# Patient Record
Sex: Male | Born: 1965 | Race: White | Hispanic: No | Marital: Single | Smoking: Current every day smoker
Health system: Southern US, Community
[De-identification: ages and names within clinical notes are randomized; demographics above are authoritative.]

## PROBLEM LIST (undated history)

## (undated) DIAGNOSIS — C801 Malignant (primary) neoplasm, unspecified: Secondary | ICD-10-CM

## (undated) DIAGNOSIS — Z87442 Personal history of urinary calculi: Secondary | ICD-10-CM

## (undated) DIAGNOSIS — E119 Type 2 diabetes mellitus without complications: Secondary | ICD-10-CM

## (undated) DIAGNOSIS — N289 Disorder of kidney and ureter, unspecified: Secondary | ICD-10-CM

## (undated) DIAGNOSIS — J449 Chronic obstructive pulmonary disease, unspecified: Secondary | ICD-10-CM

## (undated) DIAGNOSIS — C61 Malignant neoplasm of prostate: Secondary | ICD-10-CM

## (undated) DIAGNOSIS — I10 Essential (primary) hypertension: Secondary | ICD-10-CM

## (undated) DIAGNOSIS — K219 Gastro-esophageal reflux disease without esophagitis: Secondary | ICD-10-CM

## (undated) DIAGNOSIS — R0600 Dyspnea, unspecified: Secondary | ICD-10-CM

## (undated) DIAGNOSIS — M1990 Unspecified osteoarthritis, unspecified site: Secondary | ICD-10-CM

## (undated) DIAGNOSIS — Z923 Personal history of irradiation: Secondary | ICD-10-CM

## (undated) DIAGNOSIS — R32 Unspecified urinary incontinence: Secondary | ICD-10-CM

## (undated) DIAGNOSIS — R51 Headache: Secondary | ICD-10-CM

## (undated) HISTORY — DX: Personal history of irradiation: Z92.3

## (undated) HISTORY — PX: KIDNEY STONE SURGERY: SHX686

## (undated) HISTORY — DX: Disorder of kidney and ureter, unspecified: N28.9

## (undated) HISTORY — PX: WISDOM TOOTH EXTRACTION: SHX21

---

## 1998-10-17 ENCOUNTER — Emergency Department (HOSPITAL_COMMUNITY): Admission: EM | Admit: 1998-10-17 | Discharge: 1998-10-17 | Payer: Self-pay | Admitting: Emergency Medicine

## 1998-10-17 ENCOUNTER — Encounter: Payer: Self-pay | Admitting: Emergency Medicine

## 2000-01-10 ENCOUNTER — Emergency Department (HOSPITAL_COMMUNITY): Admission: EM | Admit: 2000-01-10 | Discharge: 2000-01-11 | Payer: Self-pay | Admitting: Emergency Medicine

## 2000-01-11 ENCOUNTER — Encounter: Payer: Self-pay | Admitting: Emergency Medicine

## 2000-01-23 ENCOUNTER — Encounter: Payer: Self-pay | Admitting: *Deleted

## 2000-01-23 ENCOUNTER — Encounter: Admission: RE | Admit: 2000-01-23 | Discharge: 2000-01-23 | Payer: Self-pay | Admitting: *Deleted

## 2000-07-23 ENCOUNTER — Emergency Department (HOSPITAL_COMMUNITY): Admission: EM | Admit: 2000-07-23 | Discharge: 2000-07-23 | Payer: Self-pay | Admitting: Emergency Medicine

## 2000-07-30 ENCOUNTER — Emergency Department (HOSPITAL_COMMUNITY): Admission: EM | Admit: 2000-07-30 | Discharge: 2000-07-30 | Payer: Self-pay | Admitting: Emergency Medicine

## 2003-08-01 ENCOUNTER — Encounter: Admission: RE | Admit: 2003-08-01 | Discharge: 2003-08-01 | Payer: Self-pay | Admitting: Family Medicine

## 2003-08-25 ENCOUNTER — Emergency Department (HOSPITAL_COMMUNITY): Admission: EM | Admit: 2003-08-25 | Discharge: 2003-08-26 | Payer: Self-pay | Admitting: Emergency Medicine

## 2004-03-31 ENCOUNTER — Emergency Department (HOSPITAL_COMMUNITY): Admission: EM | Admit: 2004-03-31 | Discharge: 2004-03-31 | Payer: Self-pay | Admitting: Emergency Medicine

## 2004-04-26 ENCOUNTER — Ambulatory Visit (HOSPITAL_BASED_OUTPATIENT_CLINIC_OR_DEPARTMENT_OTHER): Admission: RE | Admit: 2004-04-26 | Discharge: 2004-04-26 | Payer: Self-pay | Admitting: Urology

## 2004-09-03 ENCOUNTER — Emergency Department (HOSPITAL_COMMUNITY): Admission: EM | Admit: 2004-09-03 | Discharge: 2004-09-03 | Payer: Self-pay | Admitting: Emergency Medicine

## 2004-09-26 ENCOUNTER — Emergency Department (HOSPITAL_COMMUNITY): Admission: EM | Admit: 2004-09-26 | Discharge: 2004-09-27 | Payer: Self-pay | Admitting: Emergency Medicine

## 2004-10-01 ENCOUNTER — Ambulatory Visit (HOSPITAL_COMMUNITY): Admission: RE | Admit: 2004-10-01 | Discharge: 2004-10-01 | Payer: Self-pay | Admitting: Urology

## 2007-08-17 ENCOUNTER — Emergency Department (HOSPITAL_COMMUNITY): Admission: EM | Admit: 2007-08-17 | Discharge: 2007-08-17 | Payer: Self-pay | Admitting: Emergency Medicine

## 2009-10-22 ENCOUNTER — Encounter: Admission: RE | Admit: 2009-10-22 | Discharge: 2009-10-22 | Payer: Self-pay | Admitting: Emergency Medicine

## 2010-04-28 ENCOUNTER — Encounter: Payer: Self-pay | Admitting: Urology

## 2010-08-23 NOTE — Op Note (Signed)
Tony Leon, ALDOUS                 ACCOUNT NO.:  [id number]   MEDICAL RECORD NO.:  [id number]          PATIENT TYPE:  AMB   LOCATION:  [facility]                         FACILITY:  [facility]   PHYSICIAN:  Jamison Neighbor, M.D.  DATE OF BIRTH:  30-Oct-1965   DATE OF PROCEDURE:  04/26/2004  DATE OF DISCHARGE:                                 OPERATIVE REPORT   PREOPERATIVE DIAGNOSIS:  Distal right ureteral calculus.   POSTOPERATIVE DIAGNOSIS:  Distal right ureteral calculus.   PROCEDURE:  Cystoscopy, right retrograde, right ureteroscopy with basket  extraction, right double-J catheter insertion.   SURGEON:  Jamison Neighbor, M.D.   ANESTHESIA:  General.   COMPLICATIONS:  None.   DRAINS:  A 6 French x26 cm double-J catheter.   BRIEF HISTORY:  This 45 year old male developed pain on the right-hand side  and was found on imaging studies to have a small stone impacted in the  distal right ureter.  Because the stone appeared to be small, in the range  of 2-3 mm, it was thought that it would pass.  He was given Uroxatral to aid  in the passage of the stone but has been unable to do so.  Patient has  burning, dysuria, urgency, and frequency caused by the impacted stone but  really does not have all that much in the way of pain.  Patient would like  to have this extracted.  He understands the risks and benefits of the  procedure and gave full informed consent.   PROCEDURE:  After the successful induction of general anesthesia, the  patient was placed in the dorsal lithotomy position, prepped with Betadine,  and draped in the usual sterile fashion.  Cystoscopy was performed.  The  urethra was visualized in its entirety and found to be normal.  Beyond the  verumontanum, the patient had no significant prostatic obstruction.  The  bladder was carefully inspected, and it was free of any tumor or stone.  The  left ureteral orifice was normal in configuration and location.  The right  ureteral orifice  was in an appropriate location, but there was clearly  obstruction in that area with heaped-up edema of the mucosa noted.  A  guidewire was passed into the ureter, but a retrograde study did not allow  any contrast to flow past the clear-cut obstruction in the very distal  portion of the intramural tunnel.  A guidewire was negotiated past that  area.  The ureteroscope was inserted but could not be passed beyond that  spot.  There did appear to be a tight, strictured area.  A balloon was then  passed over the wire, and the distal ureter was balloon-dilated with a 4 cm  balloon.  The ureteroscope was then reintroduced and passed up to the area  just below the UPJ.  The proximal ureter was dilated, but otherwise normal.  In the very distal ureter, there was a stone that was partially underneath  the mucosa following the balloon dilation.  A basket was placed around the  stone, which was then extracted.  The ureteroscope was  reinserted, and the  ureter was rechecked.  There was no evidence of injury.  There was no sign  of any other stone material.  The patient had enough swelling and  abnormalities of the distal ureter that it was felt he should have a stent.  A 6 French x26 cm double-J was passed over the guidewire and allowed to  coil, both in the kidney and in the pelvis.  The guidewire was  removed.  Cystoscopic examination confirmed that there was good placement of  the stent and appropriate position within the bladder.  The string was left  attached and was attached to the patient with tape.  This can be removed on  Sunday or Monday of this weekend.      RJE/MEDQ  D:  04/26/2004  T:  04/26/2004  Job:  [id number]   cc:   [facility]

## 2010-08-23 NOTE — Op Note (Signed)
NAMEDIARRA, Tony Leon                 ACCOUNT NO.:  [id number]   MEDICAL RECORD NO.:  [id number]          PATIENT TYPE:  AMB   LOCATION:  DAY                          FACILITY:  [facility]   PHYSICIAN:  Jamison Neighbor, M.D.  DATE OF BIRTH:  1965/06/18   DATE OF PROCEDURE:  10/01/2004  DATE OF DISCHARGE:                                 OPERATIVE REPORT   PREOPERATIVE DIAGNOSIS:  Distal left ureteral calculus.   POSTOPERATIVE DIAGNOSIS:  Distal left ureteral calculi.   PROCEDURE:  Cystoscopy, left retrograde ureteroscopy, left basket  extraction, and left double-J catheter insertion.   SURGEON:  Jamison Neighbor, M.D.   ANESTHESIA:  General.   COMPLICATIONS:  None.   DRAINS:  A 26 cm x 6 French double-J catheter attached to a string.   HISTORY:  This 45 year old male has had a stone extraction done on the right-  hand side about a year ago.  The patient was thought to have a small stone  in the left kidney, which was thought would pass.  The patient was trying to  pass this now for some time, and it has been passed into the distal left  ureter.  Recent CT scan confirmed the presence of the stone impacted in the  ureter.  The patient is uncomfortable and would like to have this removed.  He understands the risks and benefits of the procedure and gave full,  informed consent.   PROCEDURE:  After successful induction of general anesthesia, the patient  was placed in the dorsal lithotomy position and prepped with Betadine,  draped in the usual sterile fashion.  A cystoscopy was performed, and the  urethra was visualized in its entirety.  It was found to be normal beyond  the verumontanum.  There was minimal prostatic enlargement and no signs of  obstruction.  The bladder was carefully inspected.  It was free of any tumor  or stones.  Both ureteral orifices were normal in configuration and  location.  A retrograde study performed on the left-hand side showed a  severely impacted ureter.   Attempts to get a wire passed were unsuccessful,  and there did appear to be a little bit of extravasation of contrast due to  the manipulation.  The ureteroscope was inserted, and it was actually passed  beyond the stone, and normal ureter was identified.  A guidewire was then  passed through the ureteroscope and up into the kidney.  The ureteroscope  was withdrawn and then reinserted alongside the guidewire.  The patient was  found to actually have two stones, the largest had been previously  identified, and then a very small stone adjacent to it.  These were grasped  in the nitinol basket and removed simultaneously.  The larger stone will be  sent for specimen.  The smaller stone was flushed out of the bladder.  The  ureteroscope was reinserted, and the ureter was felt to be unremarkable.  There was some mucosal damage where the stone had been impacted, and it was  felt that a stent should be placed.  A stent was  passed over the guidewire  but attached to a string so that the patient can removed  this in 2-3 days.  The bladder was drained.  The patient tolerated the procedure well and was  taken to  the recovery room in good condition.  He will be sent home with Tylox,  Pyridium Plus, and Septra DS and is to return to see me in followup in about  two weeks.  He will be instructed to pull the catheter out in three days if  he can tolerate it.     _______________    RJE/MEDQ  D:  10/01/2004  T:  10/01/2004  Job:  [id number]   cc:   Oley Balm. Georgina Pillion, M.D.  [address]  [place]  [place] [address]  Fax: [phone]

## 2012-04-19 ENCOUNTER — Encounter (HOSPITAL_COMMUNITY): Payer: Self-pay | Admitting: *Deleted

## 2012-04-19 ENCOUNTER — Emergency Department (HOSPITAL_COMMUNITY)
Admission: EM | Admit: 2012-04-19 | Discharge: 2012-04-19 | Disposition: A | Discharge status: Home / Self Care | Payer: BC Managed Care – PPO | Attending: Emergency Medicine | Admitting: Emergency Medicine

## 2012-04-19 ENCOUNTER — Emergency Department (HOSPITAL_COMMUNITY): Payer: BC Managed Care – PPO

## 2012-04-19 DIAGNOSIS — I10 Essential (primary) hypertension: Secondary | ICD-10-CM

## 2012-04-19 DIAGNOSIS — Z79899 Other long term (current) drug therapy: Secondary | ICD-10-CM | POA: Insufficient documentation

## 2012-04-19 DIAGNOSIS — F17200 Nicotine dependence, unspecified, uncomplicated: Secondary | ICD-10-CM | POA: Insufficient documentation

## 2012-04-19 DIAGNOSIS — R51 Headache: Secondary | ICD-10-CM

## 2012-04-19 DIAGNOSIS — Z7982 Long term (current) use of aspirin: Secondary | ICD-10-CM | POA: Insufficient documentation

## 2012-04-19 MED ORDER — LISINOPRIL 10 MG PO TABS: 10.0000 mg | ORAL_TABLET | Freq: Every day | ORAL | Status: DC

## 2012-04-19 MED ORDER — KETOROLAC TROMETHAMINE 60 MG/2ML IM SOLN
60.0000 mg | Freq: Once | INTRAMUSCULAR | Status: AC
  Administered 2012-04-19: 60 mg via INTRAMUSCULAR
  Filled 2012-04-19: qty 2

## 2012-04-19 NOTE — ED Provider Notes (Signed)
History     CSN: [id number]  Arrival date & time 04/19/12  7829   First MD Initiated Contact with Patient 04/19/12 0701      Chief Complaint  Patient presents with  . Headache    (Consider location/radiation/quality/duration/timing/severity/associated sxs/prior treatment) HPI Comments: Patient started with cough on Friday.  After coughing he developed a severe pain in the back of his head, like he was "hit with a bat".  This lasted for several hours and seemed to improve.  It happened again yesterday and has persisted into today.  He denies fevers or chills.  No n/v/d.  Has checked his blood pressure on several occasions and was high.  He then took a dose of his roommate's bp med and came here to be evaluated.  Patient is a 47 y.o. male presenting with headaches. The history is provided by the patient.  Headache  This is a new problem. Episode onset: 3 days ago. The problem occurs constantly. The problem has been gradually improving. The headache is associated with bright light and coughing. The pain is located in the occipital region. The quality of the pain is described as throbbing. The pain is moderate. The pain does not radiate. Pertinent negatives include no fever. He has tried nothing for the symptoms.    History reviewed. No pertinent past medical history.  History reviewed. No pertinent past surgical history.  No family history on file.  History  Substance Use Topics  . Smoking status: Current Every Day Smoker -- 2.0 packs/day    Types: Cigarettes  . Smokeless tobacco: Not on file  . Alcohol Use: No      Review of Systems  Constitutional: Negative for fever.  Neurological: Positive for headaches.  All other systems reviewed and are negative.    Allergies  Review of patient's allergies indicates no known allergies.  Home Medications   Current Outpatient Rx  Name  Route  Sig  Dispense  Refill  . ASPIRIN 325 MG PO TBEC   Oral   Take 325 mg by mouth daily.         . IBUPROFEN 200 MG PO TABS   Oral   Take 200 mg by mouth every 6 (six) hours as needed. pain         . LISINOPRIL 10 MG PO TABS   Oral   Take 10 mg by mouth daily.           BP 138/89  Pulse 78  Temp 97.7 F (36.5 C)  Resp 20  SpO2 98%  Physical Exam  Nursing note and vitals reviewed. Constitutional: He is oriented to person, place, and time. He appears well-developed and well-nourished. No distress.  HENT:  Head: Normocephalic and atraumatic.  Mouth/Throat: Oropharynx is clear and moist.  Eyes: EOM are normal. Pupils are equal, round, and reactive to light.       There is no papilledema upon fundoscopic exam.    Neck: Normal range of motion. Neck supple.  Cardiovascular: Normal rate and regular rhythm.  Exam reveals no friction rub.   No murmur heard. Pulmonary/Chest: Effort normal and breath sounds normal. No respiratory distress.  Abdominal: Soft. Bowel sounds are normal. He exhibits no distension.  Musculoskeletal: Normal range of motion. He exhibits no edema.  Lymphadenopathy:    He has no cervical adenopathy.  Neurological: He is alert and oriented to person, place, and time. No cranial nerve deficit. He exhibits normal muscle tone. Coordination normal.  Skin: Skin is warm and dry.  He is not diaphoretic.    ED Course  Procedures (including critical care time)  Labs Reviewed - No data to display Ct Head Wo Contrast  04/19/2012  *RADIOLOGY REPORT*  Clinical Data: Hip pain.  Headaches.  Hypertension.  CT HEAD WITHOUT CONTRAST  Technique:  Contiguous axial images were obtained from the base of the skull through the vertex without contrast.  Comparison: None.  Findings: No evidence of acute infarct, acute hemorrhage, mass lesion, mass effect or hydrocephalus.  No air fluid levels in the paranasal sinuses or mastoid air cells.  IMPRESSION: Negative.   Original Report Authenticated By: Leanna Battles, M.D.      No diagnosis found.    MDM  The ct of the  head is negative for tumor or bleed.  He was given toradol for his pain.  His blood pressure initially was elevated, but normalized within minutes of it being rechecked.  He is neurologically intact and otherwise appears well.  He has no pcp and wants to be started on an antihypertensive until he can obtain a pcp.        Geoffery Lyons, MD 04/19/12 [phone]

## 2012-04-19 NOTE — ED Notes (Signed)
Pt states Friday morning coughed and had extreme pain in back of head; over the weekend has had waves of a headache; has checked bp multiple times and has been elevated; pt took friends lisinopril 10 mg at 540 am this morning

## 2012-11-19 ENCOUNTER — Other Ambulatory Visit: Payer: Self-pay | Admitting: Urology

## 2012-11-19 DIAGNOSIS — C61 Malignant neoplasm of prostate: Secondary | ICD-10-CM

## 2012-11-22 ENCOUNTER — Other Ambulatory Visit: Payer: Self-pay | Admitting: Urology

## 2012-11-30 ENCOUNTER — Encounter (HOSPITAL_COMMUNITY)
Admission: RE | Admit: 2012-11-30 | Discharge: 2012-11-30 | Disposition: A | Discharge status: Home / Self Care | Payer: BC Managed Care – PPO | Source: Ambulatory Visit | Attending: Urology | Admitting: Urology

## 2012-11-30 ENCOUNTER — Encounter (HOSPITAL_COMMUNITY): Payer: Self-pay | Admitting: Pharmacy Technician

## 2012-11-30 DIAGNOSIS — C61 Malignant neoplasm of prostate: Secondary | ICD-10-CM | POA: Insufficient documentation

## 2012-11-30 MED ORDER — TECHNETIUM TC 99M MEDRONATE IV KIT
25.0000 | PACK | Freq: Once | INTRAVENOUS | Status: AC | PRN
  Administered 2012-11-30: 25 via INTRAVENOUS

## 2012-12-10 ENCOUNTER — Encounter (HOSPITAL_COMMUNITY)
Admission: RE | Admit: 2012-12-10 | Discharge: 2012-12-10 | Disposition: A | Discharge status: Home / Self Care | Payer: BC Managed Care – PPO | Source: Ambulatory Visit | Attending: Urology | Admitting: Urology

## 2012-12-10 ENCOUNTER — Ambulatory Visit (HOSPITAL_COMMUNITY)
Admission: RE | Admit: 2012-12-10 | Discharge: 2012-12-10 | Disposition: A | Discharge status: Home / Self Care | Payer: BC Managed Care – PPO | Source: Ambulatory Visit | Attending: Urology | Admitting: Urology

## 2012-12-10 ENCOUNTER — Encounter (HOSPITAL_COMMUNITY): Payer: Self-pay

## 2012-12-10 DIAGNOSIS — Z01812 Encounter for preprocedural laboratory examination: Secondary | ICD-10-CM | POA: Insufficient documentation

## 2012-12-10 DIAGNOSIS — Z01810 Encounter for preprocedural cardiovascular examination: Secondary | ICD-10-CM | POA: Insufficient documentation

## 2012-12-10 DIAGNOSIS — Z87442 Personal history of urinary calculi: Secondary | ICD-10-CM

## 2012-12-10 DIAGNOSIS — Z01818 Encounter for other preprocedural examination: Secondary | ICD-10-CM | POA: Insufficient documentation

## 2012-12-10 DIAGNOSIS — C801 Malignant (primary) neoplasm, unspecified: Secondary | ICD-10-CM | POA: Insufficient documentation

## 2012-12-10 DIAGNOSIS — C61 Malignant neoplasm of prostate: Secondary | ICD-10-CM | POA: Insufficient documentation

## 2012-12-10 HISTORY — DX: Personal history of urinary calculi: Z87.442

## 2012-12-10 HISTORY — DX: Malignant (primary) neoplasm, unspecified: C80.1

## 2012-12-10 HISTORY — DX: Type 2 diabetes mellitus without complications: E11.9

## 2012-12-10 HISTORY — DX: Gastro-esophageal reflux disease without esophagitis: K21.9

## 2012-12-10 HISTORY — DX: Headache: R51

## 2012-12-10 HISTORY — DX: Essential (primary) hypertension: I10

## 2012-12-10 LAB — BASIC METABOLIC PANEL
BUN: 11 mg/dL (ref 6–23)
CO2: 30 mEq/L (ref 19–32)
Calcium: 9.9 mg/dL (ref 8.4–10.5)
Chloride: 100 mEq/L (ref 96–112)
Creatinine, Ser: 0.96 mg/dL (ref 0.50–1.35)
GFR calc Af Amer: >90 mL/min (ref >90)

## 2012-12-10 LAB — CBC
HCT: 44.3 % (ref 39.0–52.0)
MCH: 32.4 pg (ref 26.0–34.0)
MCV: 91.9 fL (ref 78.0–100.0)
RDW: 13.2 % (ref 11.5–15.5)
WBC: 7.8 10*3/uL (ref 4.0–10.5)

## 2012-12-10 NOTE — Pre-Procedure Instructions (Addendum)
12-10-12 EKG/ CXR done today. Pt. Stop/Bang score = 4 with preop screening, note faxed to Dr. Pricilla Loveless.

## 2012-12-10 NOTE — Patient Instructions (Addendum)
20 ZAHKI HOOGENDOORN  12/10/2012   Your procedure is scheduled on:  9-10 -2014  Report to [facility] at     0930   AM .  Call this number if you have problems the morning of surgery: [phone]  Or Presurgical Testing [phone](Raivyn Kabler)   Remember: Follow any bowel prep instructions per MD office.    Do not eat food:After Midnight.  May have clear liquids:up to 6 Hours before arrival. Nothing after : 0600 AM  Clear liquids include soda, tea, black coffee, apple or grape juice, broth.  Take these medicines the morning of surgery with A SIP OF WATER: none   Do not wear jewelry, make-up or nail polish.  Do not wear lotions, powders, or perfumes. You may wear deodorant.  Do not shave 12 hours prior to first CHG shower(legs and under arms).(face and neck okay.)  Do not bring valuables to the hospital.  Contacts, dentures or bridgework,body piercing,  may not be worn into surgery.  Leave suitcase in the car. After surgery it may be brought to your room.  For patients admitted to the hospital, checkout time is 11:00 AM the day of discharge.   Patients discharged the day of surgery will not be allowed to drive home. Must have responsible person with you x 24 hours once discharged.  Name and phone number of your driver: Marjie Skiff- [phone] cell  Special Instructions: CHG(Chlorhedine 4%-"Hibiclens","Betasept","Aplicare") Shower Use Special Wash: see special instructions.(avoid face and genitals)   Please read over the following fact sheets that you were given: Blood Transfusion fact sheet, Incentive Spirometry Instruction.    Failure to follow these instructions may result in Cancellation of your surgery.   Patient signature_______________________________________________________

## 2012-12-10 NOTE — Progress Notes (Signed)
Your Pt has screened with an elevated risk for obstructive sleep apnea using the Stop-Bang tool during a presurgical  Visit. A score of four or greater is an elevated risk. 

## 2012-12-14 NOTE — H&P (Signed)
ctive Problems Problems  1. Prostate Hard Area Or Nodule 600.10 2. PSA,Elevated 790.93  History of Present Illness  Mr. Tony Leon is a 47 yo male sent by C. Wells NP for an elevated PSA of 16.8 in 6/14.   He had another level in January that was also elevated.  He is voiding without complaints.  He has occasional nocturia.  He has no history of prostatitis or a family history of prostate cancer.  He has a history of stones that were treated by Dr. Logan Bores prior to 2006 and required ureteroscopy bilaterally with stents.  He was recently diagnosed with diabetes and gout.  He was also given losartin and had itching and had to stop it.  He still has itching which he has to treat with benadryl.  He was on metformin but had to stop that.  He was given Cipro on 6/27 for the high PSA and he remains on those.   Past Medical History Problems  1. History of  Diabetes Mellitus 250.00 2. History of  Gout 274.9 3. History of  Hypertension 401.9 4. History of  Nephrolithiasis V13.01  Surgical History Problems  1. History of  Cystoscopy With Ureteroscopy With Removal Of Calculus  Current Meds 1. Ciprofloxacin HCl 500 MG Oral Tablet; Therapy: 27Jun2014 to 2. Lisinopril 10 MG Oral Tablet; Therapy: 27Jun2014 to 3. Triamcinolone Acetonide 0.1 % External Ointment; Therapy: 27Jun2014 to  Allergies Medication  1. Losartan Potassium TABS  Family History Problems  1. Family history of  Family Health Status Number Of Children  Social History Problems    Alcohol Use   Caffeine Use   Marital History - Single   Occupation:   Tobacco Use 305.1  Review of Systems Genitourinary, constitutional, skin, eye, otolaryngeal, hematologic/lymphatic, cardiovascular, pulmonary, endocrine, musculoskeletal, gastrointestinal, neurological and psychiatric system(s) were reviewed and pertinent findings if present are noted.  Gastrointestinal: nausea   The patient presents with complaints of diarrhea (with the  metformin).  Integumentary: pruritus.  Eyes: blurred vision.  Respiratory: shortness of breath and cough.    Vitals Vital Signs [Data Includes: Last 1 Day]  08Jul2014 09:44AM  BMI Calculated: 37.22 BSA Calculated: 2.34 Height: 5 ft 10 in Weight: 260 lb  Blood Pressure: 127 / 81 Temperature: 98.1 F Heart Rate: 91  Physical Exam Constitutional: Well nourished and well developed . No acute distress.  ENT:. The ears and nose are normal in appearance.  Neck: The appearance of the neck is normal and no neck mass is present.  Pulmonary: No respiratory distress and normal respiratory rhythm and effort.  Cardiovascular: Heart rate and rhythm are normal . No peripheral edema.  Abdomen: The abdomen is obese. The abdomen is soft and nontender. No masses are palpated. No CVA tenderness. No hernias are palpable. No hepatosplenomegaly noted.  Rectal: Rectal exam demonstrates normal sphincter tone, no tenderness and no masses. Estimated prostate size is 2+. The prostate has a palpable nodule involving the right, apex, mid aspect of the prostate, is indurated (bilateral) and is not tender. The left seminal vesicle is nonpalpable. The right seminal vesicle is nonpalpable. The perineum is normal on inspection.  Genitourinary: Examination of the penis demonstrates no discharge, no masses, no lesions and a normal meatus. The scrotum is without lesions. The right epididymis is palpably normal and non-tender. The left epididymis is palpably normal and non-tender. The right testis is non-tender and without masses. The left testis is non-tender and without masses.  Lymphatics: The supraclavicular, femoral and inguinal nodes are not enlarged  or tender.  Skin: Normal skin turgor, no visible rash and no visible skin lesions.  Neuro/Psych:. Mood and affect are appropriate. Normal sensation of the perineum/perianal region (S3,4,5).    Results/Data Urine [Data Includes: Last 1 Day]   08Jul2014  COLOR YELLOW    APPEARANCE CLEAR   SPECIFIC GRAVITY 1.020   pH 5.5   GLUCOSE > 1000 mg/dL  BILIRUBIN NEG   KETONE NEG mg/dL  BLOOD TRACE   PROTEIN NEG mg/dL  UROBILINOGEN 0.2 mg/dL  NITRITE NEG   LEUKOCYTE ESTERASE NEG   SQUAMOUS EPITHELIAL/HPF RARE   WBC NONE SEEN WBC/hpf  RBC 0-2 RBC/hpf  BACTERIA RARE   CRYSTALS NONE SEEN   CASTS NONE SEEN    The following clinical lab reports were reviewed:  I have reviewed his labs from his PCP.    Assessment Assessed  1. Prostate Hard Area Or Nodule 600.10 2. PSA,Elevated 790.93   He has a markedly elevated PSA with prostate induration and nodular.   And on biospy was found to have extensive prostate cancer both Gleason 6 and 7 that is stage T2a N0 M0  Plan Health Maintenance (V70.0)  1. UA With REFLEX  Done: 08Jul2014 09:07AM PSA,Elevated (790.93)  2. Levofloxacin 500 MG Oral Tablet; Take one tablet daily starting day before procedure; Therapy:  08Jul2014 to (Evaluate:11Jul2014); Last Rx:08Jul2014 3. Follow-up Office  Follow-up  Requested for: 08Jul2014 4. PROSTATE BIOPSY ULTRASOUND  Requested for: 08Jul2014  He has elected robotic prostatectomy with node dissection for initial therapy. Marland Kitchen

## 2012-12-15 ENCOUNTER — Ambulatory Visit (HOSPITAL_COMMUNITY): Payer: BC Managed Care – PPO | Admitting: Anesthesiology

## 2012-12-15 ENCOUNTER — Encounter (HOSPITAL_COMMUNITY): Payer: Self-pay | Admitting: Anesthesiology

## 2012-12-15 ENCOUNTER — Observation Stay (HOSPITAL_COMMUNITY)
Admission: RE | Admit: 2012-12-15 | Discharge: 2012-12-16 | Disposition: A | Discharge status: Home / Self Care | Payer: BC Managed Care – PPO | Source: Ambulatory Visit | Attending: Urology | Admitting: Urology

## 2012-12-15 ENCOUNTER — Encounter (HOSPITAL_COMMUNITY): Payer: Self-pay | Admitting: *Deleted

## 2012-12-15 ENCOUNTER — Encounter (HOSPITAL_COMMUNITY)
Admission: RE | Disposition: A | Discharge status: Home / Self Care | Payer: Self-pay | Source: Ambulatory Visit | Attending: Urology

## 2012-12-15 DIAGNOSIS — M109 Gout, unspecified: Secondary | ICD-10-CM | POA: Insufficient documentation

## 2012-12-15 DIAGNOSIS — E119 Type 2 diabetes mellitus without complications: Secondary | ICD-10-CM | POA: Insufficient documentation

## 2012-12-15 DIAGNOSIS — Z87442 Personal history of urinary calculi: Secondary | ICD-10-CM | POA: Insufficient documentation

## 2012-12-15 DIAGNOSIS — C61 Malignant neoplasm of prostate: Principal | ICD-10-CM | POA: Insufficient documentation

## 2012-12-15 DIAGNOSIS — Z79899 Other long term (current) drug therapy: Secondary | ICD-10-CM | POA: Insufficient documentation

## 2012-12-15 DIAGNOSIS — I10 Essential (primary) hypertension: Secondary | ICD-10-CM | POA: Insufficient documentation

## 2012-12-15 HISTORY — PX: ROBOT ASSISTED LAPAROSCOPIC RADICAL PROSTATECTOMY: SHX5141

## 2012-12-15 LAB — HEMOGLOBIN AND HEMATOCRIT, BLOOD
HCT: 42.1 % (ref 39.0–52.0)
Hemoglobin: 14.4 g/dL (ref 13.0–17.0)

## 2012-12-15 LAB — GLUCOSE, CAPILLARY
Glucose-Capillary: 147 mg/dL — ABNORMAL HIGH (ref 70–99)
Glucose-Capillary: 152 mg/dL — ABNORMAL HIGH (ref 70–99)
Glucose-Capillary: 157 mg/dL — ABNORMAL HIGH (ref 70–99)

## 2012-12-15 LAB — ABO/RH: ABO/RH(D): O POS

## 2012-12-15 LAB — TYPE AND SCREEN: Antibody Screen: NEGATIVE

## 2012-12-15 SURGERY — ROBOTIC ASSISTED LAPAROSCOPIC RADICAL PROSTATECTOMY
Anesthesia: General | Site: Abdomen | Wound class: Clean Contaminated

## 2012-12-15 MED ORDER — KETOROLAC TROMETHAMINE 15 MG/ML IJ SOLN
15.0000 mg | Freq: Four times a day (QID) | INTRAMUSCULAR | Status: DC
  Administered 2012-12-15 – 2012-12-16 (×3): 15 mg via INTRAVENOUS
  Filled 2012-12-15 (×6): qty 1

## 2012-12-15 MED ORDER — MIDAZOLAM HCL 5 MG/5ML IJ SOLN
INTRAMUSCULAR | Status: DC | PRN
  Administered 2012-12-15: 2 mg via INTRAVENOUS

## 2012-12-15 MED ORDER — SUCCINYLCHOLINE CHLORIDE 20 MG/ML IJ SOLN
INTRAMUSCULAR | Status: DC | PRN
  Administered 2012-12-15: 100 mg via INTRAVENOUS

## 2012-12-15 MED ORDER — MORPHINE SULFATE 2 MG/ML IJ SOLN
2.0000 mg | INTRAMUSCULAR | Status: DC | PRN
  Administered 2012-12-15 – 2012-12-16 (×2): 2 mg via INTRAVENOUS
  Filled 2012-12-15 (×2): qty 1

## 2012-12-15 MED ORDER — HYDROMORPHONE HCL PF 1 MG/ML IJ SOLN
INTRAMUSCULAR | Status: AC
  Administered 2012-12-15: 16:00:00
  Filled 2012-12-15: qty 1

## 2012-12-15 MED ORDER — GLYCOPYRROLATE 0.2 MG/ML IJ SOLN
INTRAMUSCULAR | Status: DC | PRN
  Administered 2012-12-15: .6 mg via INTRAVENOUS

## 2012-12-15 MED ORDER — HYDROCODONE-ACETAMINOPHEN 5-325 MG PO TABS
1.0000 | ORAL_TABLET | ORAL | Status: DC | PRN
  Administered 2012-12-15: 1 via ORAL
  Administered 2012-12-16: 2 via ORAL
  Filled 2012-12-15: qty 1
  Filled 2012-12-15: qty 2

## 2012-12-15 MED ORDER — PANTOPRAZOLE SODIUM 40 MG PO TBEC
40.0000 mg | DELAYED_RELEASE_TABLET | Freq: Every day | ORAL | Status: DC
  Administered 2012-12-15 – 2012-12-16 (×2): 40 mg via ORAL
  Filled 2012-12-15 (×2): qty 1

## 2012-12-15 MED ORDER — LISINOPRIL 10 MG PO TABS
10.0000 mg | ORAL_TABLET | Freq: Every day | ORAL | Status: DC
  Administered 2012-12-15: 21:00:00 10 mg via ORAL
  Filled 2012-12-15 (×2): qty 1

## 2012-12-15 MED ORDER — LABETALOL HCL 5 MG/ML IV SOLN
5.0000 mg | INTRAVENOUS | Status: DC | PRN
  Administered 2012-12-15: 5 mg via INTRAVENOUS

## 2012-12-15 MED ORDER — DIPHENHYDRAMINE HCL 25 MG PO CAPS
25.0000 mg | ORAL_CAPSULE | Freq: Four times a day (QID) | ORAL | Status: DC | PRN
  Administered 2012-12-16: 03:00:00 25 mg via ORAL
  Filled 2012-12-15: qty 1

## 2012-12-15 MED ORDER — DEXTROSE-NACL 5-0.45 % IV SOLN
INTRAVENOUS | Status: DC
  Administered 2012-12-15 – 2012-12-16 (×2): via INTRAVENOUS

## 2012-12-15 MED ORDER — HYDROCODONE-ACETAMINOPHEN 5-325 MG PO TABS: 1.0000 | ORAL_TABLET | Freq: Four times a day (QID) | ORAL | Status: DC | PRN

## 2012-12-15 MED ORDER — SODIUM CHLORIDE 0.9 % IR SOLN
Status: DC | PRN
  Administered 2012-12-15: 300 mL via INTRAVESICAL

## 2012-12-15 MED ORDER — CIPROFLOXACIN HCL 500 MG PO TABS: 500.0000 mg | ORAL_TABLET | Freq: Two times a day (BID) | ORAL | Status: DC

## 2012-12-15 MED ORDER — NEOSTIGMINE METHYLSULFATE 1 MG/ML IJ SOLN
INTRAMUSCULAR | Status: DC | PRN
  Administered 2012-12-15: 4 mg via INTRAVENOUS

## 2012-12-15 MED ORDER — ONDANSETRON HCL 4 MG/2ML IJ SOLN
INTRAMUSCULAR | Status: DC | PRN
  Administered 2012-12-15: 4 mg via INTRAVENOUS

## 2012-12-15 MED ORDER — LABETALOL HCL 5 MG/ML IV SOLN
INTRAVENOUS | Status: AC
  Administered 2012-12-15: 17:00:00
  Filled 2012-12-15: qty 4

## 2012-12-15 MED ORDER — ACETAMINOPHEN 325 MG PO TABS: 650.0000 mg | ORAL_TABLET | ORAL | Status: DC | PRN

## 2012-12-15 MED ORDER — PROPOFOL 10 MG/ML IV BOLUS
INTRAVENOUS | Status: DC | PRN
  Administered 2012-12-15: 200 mg via INTRAVENOUS

## 2012-12-15 MED ORDER — SODIUM CHLORIDE 0.9 % IV BOLUS (SEPSIS)
1000.0000 mL | Freq: Once | INTRAVENOUS | Status: AC
  Administered 2012-12-15: 1000 mL via INTRAVENOUS

## 2012-12-15 MED ORDER — HEPARIN SODIUM (PORCINE) 1000 UNIT/ML IJ SOLN
INTRAMUSCULAR | Status: AC
  Filled 2012-12-15: qty 1

## 2012-12-15 MED ORDER — HYDROMORPHONE HCL PF 1 MG/ML IJ SOLN
0.2500 mg | INTRAMUSCULAR | Status: DC | PRN
Start: 2012-12-15 — End: 2012-12-15
  Administered 2012-12-15 (×4): 0.5 mg via INTRAVENOUS

## 2012-12-15 MED ORDER — FENTANYL CITRATE 0.05 MG/ML IJ SOLN
INTRAMUSCULAR | Status: DC | PRN
  Administered 2012-12-15 (×3): 100 ug via INTRAVENOUS
  Administered 2012-12-15 (×3): 50 ug via INTRAVENOUS
  Administered 2012-12-15: 150 ug via INTRAVENOUS

## 2012-12-15 MED ORDER — OMEPRAZOLE MAGNESIUM 20 MG PO TBEC
20.0000 mg | DELAYED_RELEASE_TABLET | Freq: Every day | ORAL | Status: DC
Start: 2012-12-15 — End: 2012-12-15

## 2012-12-15 MED ORDER — CEFAZOLIN SODIUM-DEXTROSE 2-3 GM-% IV SOLR
INTRAVENOUS | Status: AC
  Filled 2012-12-15: qty 50

## 2012-12-15 MED ORDER — LACTATED RINGERS IV SOLN
INTRAVENOUS | Status: DC | PRN
  Administered 2012-12-15: 13:00:00

## 2012-12-15 MED ORDER — LIDOCAINE HCL (PF) 2 % IJ SOLN
INTRAMUSCULAR | Status: DC | PRN
  Administered 2012-12-15: 100 mg

## 2012-12-15 MED ORDER — LACTATED RINGERS IV SOLN
INTRAVENOUS | Status: DC | PRN
  Administered 2012-12-15 (×3): via INTRAVENOUS

## 2012-12-15 MED ORDER — CISATRACURIUM BESYLATE (PF) 10 MG/5ML IV SOLN
INTRAVENOUS | Status: DC | PRN
  Administered 2012-12-15: 6 mg via INTRAVENOUS
  Administered 2012-12-15: 4 mg via INTRAVENOUS
  Administered 2012-12-15: 10 mg via INTRAVENOUS

## 2012-12-15 MED ORDER — PROMETHAZINE HCL 25 MG/ML IJ SOLN: 6.2500 mg | INTRAMUSCULAR | Status: DC | PRN

## 2012-12-15 MED ORDER — BUPIVACAINE-EPINEPHRINE 0.25% -1:200000 IJ SOLN
INTRAMUSCULAR | Status: DC | PRN
  Administered 2012-12-15: 30 mL

## 2012-12-15 MED ORDER — HYDROMORPHONE HCL PF 1 MG/ML IJ SOLN
INTRAMUSCULAR | Status: DC | PRN
  Administered 2012-12-15: 1 mg via INTRAVENOUS
  Administered 2012-12-15 (×2): 0.5 mg via INTRAVENOUS

## 2012-12-15 MED ORDER — CEFAZOLIN SODIUM-DEXTROSE 2-3 GM-% IV SOLR
2.0000 g | INTRAVENOUS | Status: AC
  Administered 2012-12-15: 2 g via INTRAVENOUS

## 2012-12-15 MED ORDER — INDIGOTINDISULFONATE SODIUM 8 MG/ML IJ SOLN
INTRAMUSCULAR | Status: DC | PRN
  Administered 2012-12-15: 5 mL via INTRAVENOUS

## 2012-12-15 MED ORDER — BELLADONNA ALKALOIDS-OPIUM 16.2-60 MG RE SUPP: 1.0000 | Freq: Four times a day (QID) | RECTAL | Status: DC | PRN

## 2012-12-15 MED ORDER — LACTATED RINGERS IV SOLN: INTRAVENOUS | Status: DC

## 2012-12-15 MED ORDER — BUPIVACAINE-EPINEPHRINE PF 0.25-1:200000 % IJ SOLN
INTRAMUSCULAR | Status: AC
  Filled 2012-12-15: qty 30

## 2012-12-15 SURGICAL SUPPLY — 46 items
CANISTER SUCTION 2500CC (MISCELLANEOUS) ×2 IMPLANT
CATH FOLEY 2WAY SLVR 18FR 30CC (CATHETERS) ×2 IMPLANT
CATH ROBINSON RED A/P 16FR (CATHETERS) ×2 IMPLANT
CATH ROBINSON RED A/P 8FR (CATHETERS) ×2 IMPLANT
CATH TIEMANN FOLEY 18FR 5CC (CATHETERS) ×2 IMPLANT
CHLORAPREP W/TINT 26ML (MISCELLANEOUS) ×2 IMPLANT
CLOTH BEACON ORANGE TIMEOUT ST (SAFETY) ×2 IMPLANT
CORD HIGH FREQUENCY UNIPOLAR (ELECTROSURGICAL) ×2 IMPLANT
COVER SURGICAL LIGHT HANDLE (MISCELLANEOUS) ×2 IMPLANT
COVER TIP SHEARS 8 DVNC (MISCELLANEOUS) ×1 IMPLANT
COVER TIP SHEARS 8MM DA VINCI (MISCELLANEOUS) ×1
CUTTER ECHEON FLEX ENDO 45 340 (ENDOMECHANICALS) ×2 IMPLANT
DECANTER SPIKE VIAL GLASS SM (MISCELLANEOUS) ×1 IMPLANT
DRAPE SURG IRRIG POUCH 19X23 (DRAPES) ×2 IMPLANT
DRSG TEGADERM 2-3/8X2-3/4 SM (GAUZE/BANDAGES/DRESSINGS) ×10 IMPLANT
DRSG TEGADERM 4X4.75 (GAUZE/BANDAGES/DRESSINGS) ×2 IMPLANT
DRSG TEGADERM 6X8 (GAUZE/BANDAGES/DRESSINGS) ×4 IMPLANT
ELECT REM PT RETURN 9FT ADLT (ELECTROSURGICAL) ×2
ELECTRODE REM PT RTRN 9FT ADLT (ELECTROSURGICAL) ×1 IMPLANT
GAUZE SPONGE 2X2 8PLY STRL LF (GAUZE/BANDAGES/DRESSINGS) ×1 IMPLANT
GLOVE BIO SURGEON STRL SZ 6.5 (GLOVE) ×4 IMPLANT
GLOVE SURG SS PI 8.0 STRL IVOR (GLOVE) ×6 IMPLANT
GLOVE SURG SS PI 8.5 STRL IVOR (GLOVE) ×2
GLOVE SURG SS PI 8.5 STRL STRW (GLOVE) IMPLANT
GOWN STRL NON-REIN LRG LVL3 (GOWN DISPOSABLE) ×8 IMPLANT
GOWN STRL REIN 2XL XLG LVL4 (GOWN DISPOSABLE) ×1 IMPLANT
GOWN STRL REIN XL XLG (GOWN DISPOSABLE) ×4 IMPLANT
HOLDER FOLEY CATH W/STRAP (MISCELLANEOUS) ×2 IMPLANT
IV LACTATED RINGERS 1000ML (IV SOLUTION) ×2 IMPLANT
KIT ACCESSORY DA VINCI DISP (KITS) ×1
KIT ACCESSORY DVNC DISP (KITS) ×1 IMPLANT
NDL SAFETY ECLIPSE 18X1.5 (NEEDLE) ×1 IMPLANT
NEEDLE HYPO 18GX1.5 SHARP (NEEDLE) ×2
PACK ROBOT UROLOGY CUSTOM (CUSTOM PROCEDURE TRAY) ×2 IMPLANT
RELOAD GREEN ECHELON 45 (STAPLE) ×2 IMPLANT
SEALER TISSUE G2 CVD JAW 45CM (ENDOMECHANICALS) ×2 IMPLANT
SET TUBE IRRIG SUCTION NO TIP (IRRIGATION / IRRIGATOR) ×2 IMPLANT
SOLUTION ELECTROLUBE (MISCELLANEOUS) ×2 IMPLANT
SPONGE GAUZE 2X2 STER 10/PKG (GAUZE/BANDAGES/DRESSINGS) ×1
SUT VIC AB 2-0 SH 27 (SUTURE) ×2
SUT VIC AB 2-0 SH 27X BRD (SUTURE) IMPLANT
SUT VICRYL 0 UR6 27IN ABS (SUTURE) ×2 IMPLANT
SYR 27GX1/2 1ML LL SAFETY (SYRINGE) ×2 IMPLANT
TOWEL OR NON WOVEN STRL DISP B (DISPOSABLE) ×2 IMPLANT
TROCAR 12M 150ML BLUNT (TROCAR) ×1 IMPLANT
WATER STERILE IRR 1500ML POUR (IV SOLUTION) ×4 IMPLANT

## 2012-12-15 NOTE — Anesthesia Preprocedure Evaluation (Signed)
Anesthesia Evaluation  Patient identified by MRN, date of birth, ID band Patient awake    Reviewed: Allergy & Precautions, H&P , NPO status , Patient's Chart, lab work & pertinent test results  Airway Mallampati: II TM Distance: >3 FB Neck ROM: Full    Dental no notable dental hx.    Pulmonary neg pulmonary ROS,  breath sounds clear to auscultation  Pulmonary exam normal       Cardiovascular Exercise Tolerance: Good hypertension, Pt. on medications negative cardio ROS  Rhythm:Regular Rate:Normal     Neuro/Psych  Headaches, negative psych ROS   GI/Hepatic Neg liver ROS, GERD-  Medicated,  Endo/Other  diabetes, Type 2  Renal/GU negative Renal ROS  negative genitourinary   Musculoskeletal negative musculoskeletal ROS (+)   Abdominal (+) + obese,   Peds negative pediatric ROS (+)  Hematology negative hematology ROS (+)   Anesthesia Other Findings   Reproductive/Obstetrics negative OB ROS                           Anesthesia Physical Anesthesia Plan  ASA: III  Anesthesia Plan: General   Post-op Pain Management:    Induction: Intravenous  Airway Management Planned: Oral ETT  Additional Equipment:   Intra-op Plan:   Post-operative Plan: Extubation in OR  Informed Consent: I have reviewed the patients History and Physical, chart, labs and discussed the procedure including the risks, benefits and alternatives for the proposed anesthesia with the patient or authorized representative who has indicated his/her understanding and acceptance.   Dental advisory given  Plan Discussed with: CRNA  Anesthesia Plan Comments:         Anesthesia Quick Evaluation

## 2012-12-15 NOTE — Interval H&P Note (Signed)
History and Physical Interval Note:  12/15/2012 12:20 PM  Tony Leon  has presented today for surgery, with the diagnosis of PROSTATE CANCER   The various methods of treatment have been discussed with the patient and family. After consideration of risks, benefits and other options for treatment, the patient has consented to  Procedure(s): ROBOTIC ASSISTED LAPAROSCOPIC RADICAL PROSTATECTOMY WITH LYMPH NODE DISSECTION  (N/A) as a surgical intervention .  The patient's history has been reviewed, patient examined, no change in status, stable for surgery.  I have reviewed the patient's chart and labs.  Questions were answered to the patient's satisfaction.     Leili Eskenazi J

## 2012-12-15 NOTE — Transfer of Care (Signed)
Immediate Anesthesia Transfer of Care Note  Patient: Tony Leon  Procedure(s) Performed: Procedure(s): ROBOTIC ASSISTED LAPAROSCOPIC RADICAL PROSTATECTOMY WITH LYMPH NODE DISSECTION  (N/A)  Patient Location: PACU  Anesthesia Type:General  Level of Consciousness: awake, alert , oriented and patient cooperative  Airway & Oxygen Therapy: Patient Spontanous Breathing and Patient connected to face mask oxygen  Post-op Assessment: Report given to PACU RN and Post -op Vital signs reviewed and stable  Post vital signs: Reviewed and stable  Complications: No apparent anesthesia complications

## 2012-12-15 NOTE — Interval H&P Note (Signed)
History and Physical Interval Note:  12/15/2012 12:19 PM  Tony Leon  has presented today for surgery, with the diagnosis of PROSTATE CANCER   The various methods of treatment have been discussed with the patient and family. After consideration of risks, benefits and other options for treatment, the patient has consented to  Procedure(s): ROBOTIC ASSISTED LAPAROSCOPIC RADICAL PROSTATECTOMY WITH LYMPH NODE DISSECTION  (N/A) as a surgical intervention .  The patient's history has been reviewed, patient examined, no change in status, stable for surgery.  I have reviewed the patient's chart and labs.  Questions were answered to the patient's satisfaction.     Meleny Tregoning J

## 2012-12-15 NOTE — Op Note (Signed)
Preoperative diagnosis: Clinically localized adenocarcinoma of the prostate (clinical stage T2 Gleason)  Postoperative diagnosis: Clinically localized adenocarcinoma of the prostate (clinical stage T2 Gleason)  Procedure:  1. Robotic assisted laparoscopic radical prostatectomy (bilateral nerve sparing) 2. Bilateral robotic assisted laparoscopic pelvic lymphadenectomy  Surgeon:Norrine Ballester. M.D.  Assistant: Lujean Rave PA  Anesthesia: General  Complications: None  EBL: 100 mL   Specimens: 1. Prostate and seminal vesicles 2. Right pelvic lymph nodes 3. Left pelvic lymph nodes  Disposition of specimens: Pathology  Drains: 1. 20 Fr coude catheter 2. # 19 Blake pelvic drain  Indication: Tony Leon is a 47 y.o. year old patient with clinically localized prostate cancer but intermediate risk T2 N0 Mx prostate cancer.  After a thorough review of the management options for treatment of prostate cancer, he elected to proceed with surgical therapy and the above procedure(s).  We have discussed the potential benefits and risks of the procedure, side effects of the proposed treatment, the likelihood of the patient achieving the goals of the procedure, and any potential problems that might occur during the procedure or recuperation. Informed consent has been obtained.  Description of procedure:  The patient was taken to the operating room and a general anesthetic was administered. He was given preoperative antibiotics, placed in the dorsal lithotomy position, and prepped and draped in the usual sterile fashion. Next a preoperative timeout was performed. A urethral catheter was placed into the bladder and a site was selected near the umbilicus for placement of the camera port. This was placed using a standard open Hassan technique which allowed entry into the peritoneal cavity under direct vision and without difficulty. A 12 mm port was placed and a pneumoperitoneum established. The camera  was then used to inspect the abdomen and there was no evidence of any intra-abdominal injuries or other abnormalities. The remaining abdominal ports were then placed. 8 mm robotic ports were placed in the right lower quadrant, left lower quadrant, and far left lateral abdominal wall. A 5 mm port was placed in the right upper quadrant and a 12 mm port was placed in the right lateral abdominal wall for laparoscopic assistance. All ports were placed under direct vision without difficulty. The surgical cart was then docked.   Utilizing the cautery scissors, the bladder was reflected posteriorly allowing entry into the space of Retzius and identification of the endopelvic fascia and prostate. The periprostatic fat was then removed from the prostate allowing full exposure of the endopelvic fascia. The endopelvic fascia was then incised from the apex back to the base of the prostate bilaterally and the underlying levator muscle fibers were swept laterally off the prostate thereby isolating the dorsal venous complex. The dorsal vein was then stapled and divided with a 45 mm Flex Echelon stapler. Attention then turned to the bladder neck which was divided anteriorly thereby allowing entry into the bladder and exposure of the urethral catheter. The catheter balloon was deflated and the catheter was brought into the operative field and used to retract the prostate anteriorly. The posterior bladder neck was then examined and was divided allowing further dissection between the bladder and prostate posteriorly until the vasa deferentia and seminal vessels were identified. The vasa deferentia were isolated, divided, and lifted anteriorly. The seminal vesicles were dissected down to their tips with care to control the seminal vascular arterial blood supply. These structures were then lifted anteriorly and the space between Denonvillier's fascia and the anterior rectum was developed with a combination of sharp  and blunt  dissection. This isolated the vascular pedicles of the prostate.  The lateral prostatic fascia was then sharply incised allowing release of the neurovascular bundles bilaterally. The vascular pedicles of the prostate were then ligated with the enseal between the prostate and neurovascular bundles and divided with sharp cold scissor dissection resulting in neurovascular bundle preservation. The neurovascular bundles were then separated off the apex of the prostate and urethra bilaterally.  The urethra was then sharply transected allowing the prostate specimen to be disarticulated. The pelvis was copiously irrigated and hemostasis was ensured. There was no evidence for rectal injury.  Attention then turned to the right pelvic sidewall. The fibrofatty tissue between the external iliac vein, confluence of the iliac vessels, hypogastric artery, and Cooper's ligament was dissected free from the pelvic sidewall with care to preserve the obturator nerve. Weck clips were used for lymphostasis and hemostasis. An identical procedure was performed on the contralateral side and the lymphatic packets were removed for permanent pathologic analysis.  The nodal material on the left was suprisingly scant.  Attention then turned to the urethral anastomosis. A 2-0 Vicryl slip knot was placed between Denonvillier's fascia, the posterior bladder neck, and the posterior urethra to reapproximate these structures.  The urethral stump was quite short with a split posteriorly and anteriorly.   A second 2-0 Vicryl was placed to further reinforce the initial reapproximation.   A double-armed 3-0 Monocryl suture was then used to perform a 360 running tension-free anastomosis between the bladder neck and urethra. A new urethral catheter was then placed into the bladder and irrigated.  Initially there was leakage of irrigant but additional slack was taken up in the suture which rendered the anastamosis watertight. There were no blood  clots within the bladder.  A #19 Blake drain was then brought through the left lateral 8 mm port site and positioned appropriately within the pelvis. It was secured to the skin with a nylon suture. The surgical cart was then undocked. The right lateral 12 mm port site was closed at the fascial level with a 0 Vicryl suture placed laparoscopically. All remaining ports were then removed under direct vision. The prostate specimen was removed intact within the Endopouch retrieval bag via the periumbilical camera port site. This fascial opening was closed with two running 0 Vicryl sutures. 0.25% Marcaine was then injected into all port sites and all incisions were reapproximated at the skin level with staples. Sterile dressings were applied. The patient appeared to tolerate the procedure well and without complications. The patient was able to be extubated and transferred to the recovery unit in satisfactory condition.   Tony Pippin MD

## 2012-12-16 ENCOUNTER — Encounter (HOSPITAL_COMMUNITY): Payer: Self-pay | Admitting: Urology

## 2012-12-16 LAB — BASIC METABOLIC PANEL
GFR calc non Af Amer: 89 mL/min — ABNORMAL LOW (ref >90)
Glucose, Bld: 143 mg/dL — ABNORMAL HIGH (ref 70–99)
Potassium: 4 mEq/L (ref 3.5–5.1)
Sodium: 133 mEq/L — ABNORMAL LOW (ref 135–145)

## 2012-12-16 LAB — HEMOGLOBIN AND HEMATOCRIT, BLOOD: HCT: 37.6 % — ABNORMAL LOW (ref 39.0–52.0)

## 2012-12-16 LAB — GLUCOSE, CAPILLARY: Glucose-Capillary: 150 mg/dL — ABNORMAL HIGH (ref 70–99)

## 2012-12-16 MED ORDER — BISACODYL 10 MG RE SUPP
10.0000 mg | Freq: Once | RECTAL | Status: AC
  Administered 2012-12-16: 08:00:00 10 mg via RECTAL
  Filled 2012-12-16: qty 1

## 2012-12-16 NOTE — Discharge Summary (Signed)
I saw the patient this morning and he was doing well with minimal JP drainage and clear urine.   The JP was d/c's and he was fed.   He was then discharged home.

## 2012-12-16 NOTE — Care Management Note (Signed)
    Page 1 of 1   12/16/2012     10:47:38 AM   CARE MANAGEMENT NOTE 12/16/2012  Patient:  Tony Leon, Tony Leon   Account Number:  [id number]  Date Initiated:  12/16/2012  Documentation initiated by:  Lanier Clam  Subjective/Objective Assessment:   47 Y/O M ADMITTED W/PROSTATE CA,ELEVATED PSA.     Action/Plan:   FROM HOME.HAS PCP,PHARMACY.   Anticipated DC Date:  12/16/2012   Anticipated DC Plan:  HOME/SELF CARE      DC Planning Services  CM consult      Choice offered to / List presented to:             Status of service:  Completed, signed off Medicare Important Message given?   (If response is "NO", the following Medicare IM given date fields will be blank) Date Medicare IM given:   Date Additional Medicare IM given:    Discharge Disposition:  HOME/SELF CARE  Per UR Regulation:  Reviewed for med. necessity/level of care/duration of stay  If discussed at Long Length of Stay Meetings, dates discussed:    Comments:  12/16/12 Quintin Hjort RN,BSN NCM [phone] POD#1 ROB PROSTATECTOMY.

## 2012-12-16 NOTE — Progress Notes (Signed)
Patient ID: Tony Leon, male   DOB: 1965-09-03, 47 y.o.   MRN: [id number] 1 Day Post-Op Subjective: The patient is doing well.  No nausea or vomiting. Pain is adequately controlled. Amb well  Objective: Vital signs in last 24 hours: Temp:  [97.7 F (36.5 C)-98.5 F (36.9 C)] 97.7 F (36.5 C) (09/11 0527) Pulse Rate:  [77-101] 81 (09/11 0527) Resp:  [14-19] 18 (09/11 0527) BP: (111-185)/(66-135) 123/68 mmHg (09/11 0527) SpO2:  [96 %-100 %] 100 % (09/11 0527) Weight:  [118.043 kg (260 lb 3.8 oz)] 118.043 kg (260 lb 3.8 oz) (09/10 1746)  Intake/Output from previous day: 09/10 0701 - 09/11 0700 In: 4492.5 [P.O.:480; I.V.:3912.5; IV Piggyback:100] Out: 1500 [Urine:1320; Drains:130; Blood:50] Intake/Output this shift:    Physical Exam:  General: Alert and oriented. CV: RRR Lungs: Clear bilaterally. GI: Soft, Nondistended. Incisions: Dressings intact. Urine: Clear Extremities: Nontender, no erythema, no edema.  Lab Results:  Recent Labs  12/15/12 1626 12/16/12 0428  HGB 14.4 12.6*  HCT 42.1 37.6*      Assessment/Plan: POD# 1 s/p robotic prostatectomy.  1) SL IVF 2) Ambulate, Incentive spirometry 3) Transition to oral pain medication 4) Dulcolax suppository 5) D/C pelvic drain 6) Plan for likely discharge later today      LOS: 1 day   YARBROUGH,Zakkiyya Barno G. 12/16/2012, 7:33 AM

## 2012-12-16 NOTE — Anesthesia Postprocedure Evaluation (Signed)
  Anesthesia Post-op Note  Patient: Tony Leon  Procedure(s) Performed: Procedure(s) (LRB): ROBOTIC ASSISTED LAPAROSCOPIC RADICAL PROSTATECTOMY WITH LYMPH NODE DISSECTION  (N/A)  Patient Location: PACU  Anesthesia Type: General  Level of Consciousness: awake and alert   Airway and Oxygen Therapy: Patient Spontanous Breathing  Post-op Pain: mild  Post-op Assessment: Post-op Vital signs reviewed, Patient's Cardiovascular Status Stable, Respiratory Function Stable, Patent Airway and No signs of Nausea or vomiting  Last Vitals:  Filed Vitals:   12/16/12 0212  BP: 111/66  Pulse: 77  Temp: 36.6 C  Resp: 16    Post-op Vital Signs: stable   Complications: No apparent anesthesia complications

## 2012-12-16 NOTE — Discharge Summary (Signed)
  Date of admission: 12/15/2012  Date of discharge: 12/16/2012  Admission diagnosis: Prostate Cancer  Discharge diagnosis: Prostate Cancer  History and Physical: For full details, please see admission history and physical. Briefly, Tony Leon is a 47 y.o. gentleman with localized prostate cancer.  After discussing management/treatment options, he elected to proceed with surgical treatment.  Hospital Course: Tony Leon was taken to the operating room on 12/15/2012 and underwent a robotic assisted laparoscopic radical prostatectomy. He tolerated this procedure well and without complications. Postoperatively, he was able to be transferred to a regular hospital room following recovery from anesthesia.  He was able to begin ambulating the night of surgery. He remained hemodynamically stable overnight.  He had excellent urine output with appropriately minimal output from his pelvic drain and his pelvic drain was removed on POD #1.  He was transitioned to oral pain medication, tolerated a clear liquid diet, and had met all discharge criteria and was able to be discharged home later on POD#1.  Laboratory values:  Recent Labs  12/15/12 1626 12/16/12 0428  HGB 14.4 12.6*  HCT 42.1 37.6*    Disposition: Home  Discharge instruction: He was instructed to be ambulatory but to refrain from heavy lifting, strenuous activity, or driving. He was instructed on urethral catheter care.  Discharge medications:     Medication List         ciprofloxacin 500 MG tablet  Commonly known as:  CIPRO  Take 1 tablet (500 mg total) by mouth 2 (two) times daily. Start day prior to office visit for foley removal     diphenhydrAMINE 25 mg capsule  Commonly known as:  BENADRYL  Take 25 mg by mouth every 6 (six) hours as needed for itching or allergies.     HYDROcodone-acetaminophen 5-325 MG per tablet  Commonly known as:  NORCO  Take 1-2 tablets by mouth every 6 (six) hours as needed for pain.     lisinopril  10 MG tablet  Commonly known as:  PRINIVIL,ZESTRIL  Take 10 mg by mouth daily. Takes at bedtime     omeprazole 20 MG tablet  Commonly known as:  PRILOSEC OTC  Take 20 mg by mouth daily. Takes at bedtime        Followup: He will followup in 1 week for catheter removal and to discuss his surgical pathology results.

## 2015-10-17 ENCOUNTER — Encounter: Payer: Self-pay | Admitting: Radiation Oncology

## 2015-10-17 NOTE — Progress Notes (Signed)
GU Location of Tumor / Histology: prostate cancer  If Prostate Cancer, Gleason Score is (7(3+4)) and PSA is (0.06 on 09/27/15, 0.05 on 08/27/2015)  Tony Leon presented with signs/symptoms of: rising PSA after prostatectomy.  Biopsies revealed:   12/16/2012 Diagnosis 1. Prostate, radical resection - PROSTATIC ADENOCARCINOMA, GLEASON SCORE 3 + 4 = 7. - CARCINOMA INVOLVES LEFT APICAL MARGIN (EXTENSIVE) AND LEFT POSTERIOR MARGIN (FOCAL). - SEE ONCOLOGY TABLE. 2. Soft tissue, biopsy, peri prostatic fat - BENIGN ADIPOSE TISSUE. - ONE BENIGN LYMPH NODE (0/1). - NO CARCINOMA IDENTIFIED. 3. Lymph nodes, regional resection, left pelvic - TWO BENIGN LYMPH NODES (0/2). 4. Lymph nodes, regional resection, right pelvic - ONE BENIGN LYMPH NODE (0/1). 5. Seminal vesicle, right - BENIGN SEMINAL VESICLE. - NO CARCINOMA IDENTIFIED.  Past/Anticipated interventions by urology, if any: 12/15/2012 - Procedure: ROBOTIC ASSISTED LAPAROSCOPIC RADICAL PROSTATECTOMY WITH LYMPH NODE DISSECTION ;  Surgeon: Malka So, MD Urology  Past/Anticipated interventions by medical oncology, if any: none  Weight changes, if any: no - has lost 40 lbs in a work contest since March.  Bowel/Bladder complaints, if any: IPSS score of 3 with frequency, straining and nocturia.  Reports occasional diarrhea from eating yogurt.  Nausea/Vomiting, if any: no  Pain issues, if any:  no  SAFETY ISSUES:  Prior radiation? no  Pacemaker/ICD? no  Possible current pregnancy? no  Is the patient on methotrexate? no  Current Complaints / other details:  Reports he has an infection in his left ear.  Saw his pcp - Dr. Rock Nephew today and was put on antibiotics.  BP 112/60 mmHg  Pulse 87  Temp(Src) 98 F (36.7 C) (Oral)  Ht 5\' 10"  (1.778 m)  Wt 227 lb 8 oz (103.193 kg)  BMI 32.64 kg/m2  SpO2 100%   Wt Readings from Last 3 Encounters:  10/22/15 227 lb 8 oz (103.193 kg)  12/15/12 260 lb 3.8 oz (118.043 kg)  12/10/12 260 lb 4  oz (118.049 kg)

## 2015-10-22 ENCOUNTER — Ambulatory Visit
Admission: RE | Admit: 2015-10-22 | Discharge: 2015-10-22 | Disposition: A | Discharge status: Home / Self Care | Payer: BLUE CROSS/BLUE SHIELD | Source: Ambulatory Visit | Attending: Radiation Oncology | Admitting: Radiation Oncology

## 2015-10-22 ENCOUNTER — Encounter: Payer: Self-pay | Admitting: Radiation Oncology

## 2015-10-22 VITALS — BP 112/60 | HR 87 | Temp 98.0°F | Ht 70.0 in | Wt 227.5 lb

## 2015-10-22 DIAGNOSIS — C61 Malignant neoplasm of prostate: Secondary | ICD-10-CM

## 2015-10-22 DIAGNOSIS — Z510 Encounter for antineoplastic radiation therapy: Secondary | ICD-10-CM | POA: Insufficient documentation

## 2015-10-22 HISTORY — DX: Malignant neoplasm of prostate: C61

## 2015-10-22 NOTE — Progress Notes (Signed)
Please see the Nurse Progress Note in the MD Initial Consult Encounter for this patient. 

## 2015-10-22 NOTE — Progress Notes (Signed)
Radiation Oncology         [phone] ________________________________  Initial outpatient Consultation  Name: Tony Leon MRN: [id number]  Date: 10/22/2015  DOB: 31-Aug-1965  [id number], Malachy Mood, FNP  Suzan Garibaldi, FNP   REFERRING PHYSICIAN: Irine Seal, MD  DIAGNOSIS: PSA recurrent prostate cancer  HISTORY OF PRESENT ILLNESS::Tony Leon is a 50 y.o. male who is seen out of the courtesy of Dr. Irine Seal for an opinion concerning radiation therapy as part of management of the patient's recurrent prostate cancer on the basis of a rising PSA. In 2014 the patient presented with elevated screening PSA of 16.8. He also was noted to have a hard nodule on prostate exam. On biopsy the patient was found to have a Gleason score 7 (3+4). On 12/16/2012 the patient underwent a robotic-assisted laparoscopic radical prostatectomy with lymph node dissection. Upon pathologic review the patient was found to have a prostate adenocarcinoma with Gleason score of 7 (3+4). The tumor did involve the left apical margin, extensively and the left posterior margin focally. Patient had 2 lymph nodes from the left pelvis area showing no evidence metastatic disease. He also had 1 lymph node from the right pelvis showing no evidence of metastatic disease. The right seminal vesicle showed no evidence of malignancy. There is no evidence of lymphovascular space invasion but perineural invasion was present. The prostate week 28 g. Pathologic staging was pT2C,p N0. The patient's PSA became undetectable after his surgery. PSA 02/21/2015 was less than 0.01. A PSA however in may 2017 showed a detectable PSA of 0.5 and repeat PSA was further elevated at 0.06 on 09/27/2015. the patient is felt to have PSA recurrent prostate cancer and his now referred to radiation oncology for salvage treatment.  PREVIOUS RADIATION THERAPY: No  PAST MEDICAL HISTORY:  has a past medical history of History of kidney stones (12-10-12); Hypertension; GERD  (gastroesophageal reflux disease); Headache(784.0); Cancer ([place]) (12-10-12); Diabetes mellitus without complication ([place]); and Prostate cancer ([place]).    PAST SURGICAL HISTORY: Past Surgical History  Procedure Laterality Date  . Kidney stone surgery      x2  . Robot assisted laparoscopic radical prostatectomy N/A 12/15/2012    Procedure: ROBOTIC ASSISTED LAPAROSCOPIC RADICAL PROSTATECTOMY WITH LYMPH NODE DISSECTION ;  Surgeon: Malka So, MD;  Location: [facility];  Service: Urology;  Laterality: N/A;    FAMILY HISTORY: family history includes Prostate cancer in his father.  SOCIAL HISTORY:  reports that he has been smoking Cigarettes.  He has a 60 pack-year smoking history. He has never used smokeless tobacco. He reports that he drinks about 7.2 oz of alcohol per week. He reports that he uses illicit drugs (Marijuana).  ALLERGIES: Review of patient's allergies indicates no known allergies.  MEDICATIONS:  Current Outpatient Prescriptions  Medication Sig Dispense Refill  . aspirin 81 MG tablet Take 81 mg by mouth daily.    Marland Kitchen lisinopril (PRINIVIL,ZESTRIL) 10 MG tablet Take 10 mg by mouth daily. Takes at bedtime    . loratadine (CLARITIN) 10 MG tablet Take 10 mg by mouth daily.    . metFORMIN (GLUCOPHAGE) 1000 MG tablet Take 1,000 mg by mouth daily with breakfast.    . Multiple Vitamins-Minerals (CENTRUM SILVER ADULT 50+ PO) Take by mouth.    Marland Kitchen omeprazole (PRILOSEC OTC) 20 MG tablet Take 20 mg by mouth daily. Takes at bedtime    . diphenhydrAMINE (BENADRYL) 25 mg capsule Take 25 mg by mouth every 6 (six) hours as needed for itching or allergies.  Reported on 10/22/2015     No current facility-administered medications for this encounter.    REVIEW OF SYSTEMS:  A 15 point review of systems is documented in the electronic medical record. This was obtained by the nursing staff. However, I reviewed this with the patient to discuss relevant findings and make appropriate changes. Patient initially had  some urinary incontinence after surgery. This cleared however over the past few months this problem recurred but also has cleared up since the patient has lost approximately 40-50 pounds. No reports of hematuria or rectal bleeding. Patient does have some problems with erectile dysfunction,  issues are well documented in urology notes   PHYSICAL EXAM:  height is 5\' 10"  (1.778 m) and weight is 227 lb 8 oz (103.193 kg). His oral temperature is 98 F (36.7 C). His blood pressure is 112/60 and his pulse is 87. His oxygen saturation is 100%.   General: Alert and oriented, in no acute distress HEENT: Head is normocephalic. Extraocular movements are intact. Oropharynx is clear. Teeth in good repair Neck: Neck is supple, no palpable cervical or supraclavicular lymphadenopathy. Heart: Regular in rate and rhythm with no murmurs, rubs, or gallops. Chest: Clear to auscultation bilaterally, with no rhonchi, wheezes, or rales. Abdomen: Soft, nontender, nondistended, with no rigidity or guarding. No inguinal adenopathy Extremities: No cyanosis or edema. Lymphatics: see Neck Exam Skin: No concerning lesions. Musculoskeletal: symmetric strength and muscle tone throughout. Neurologic: Cranial nerves II through XII are grossly intact. No obvious focalities. Speech is fluent. Coordination is intact. Psychiatric: Judgment and insight are intact. Affect is appropriate. Patient is a normal uncircumcised male. No testicular masses noted on clinical exam. Rectal exam not performed in light of recent exam by urology documenting no local recurrence     ECOG = 1  LABORATORY DATA: PSA values as documented above in history of present illness Lab Results  Component Value Date   WBC 7.8 12/10/2012   HGB 12.6* 12/16/2012   HCT 37.6* 12/16/2012   MCV 91.9 12/10/2012   PLT 203 12/10/2012   Lab Results  Component Value Date   NA 133* 12/16/2012   K 4.0 12/16/2012   CL 98 12/16/2012   CO2 30 12/16/2012   GLUCOSE  143* 12/16/2012   CREATININE 1.00 12/16/2012   CALCIUM 8.1* 12/16/2012      RADIOGRAPHY: No results found.    IMPRESSION: Recurrent prostate cancer on the basis of rising PSA. We discussed issues related to salvage radiation therapy the factors that may predict for local recurrence. Patient has had approximately 3 year interval to recurrence and he did have positive surgical margins without seminal vesicle involvement at the time of his surgery. I discussed with the patient that our success in this situation is limited in the fact that we do not know for sure if he has had a local recurrence only. There are no reasonable radiologic studies to evaluate this issue given the patient's very low PSA. We discussed course of treatment side effects and potential long-term toxicities of salvage radiation in detail. After careful consideration the patient would like to proceed with therapy as soon as possible.  PLAN: The patient will be scheduled for CT simulation in the near future. I anticipate 7-1/2 weeks of radiation therapy as part of his overall management.   ------------------------------------------------  Blair Promise, PhD, MD

## 2015-10-30 ENCOUNTER — Ambulatory Visit
Admission: RE | Admit: 2015-10-30 | Discharge: 2015-10-30 | Disposition: A | Discharge status: Home / Self Care | Payer: BLUE CROSS/BLUE SHIELD | Source: Ambulatory Visit | Attending: Radiation Oncology | Admitting: Radiation Oncology

## 2015-10-30 DIAGNOSIS — Z510 Encounter for antineoplastic radiation therapy: Secondary | ICD-10-CM | POA: Diagnosis not present

## 2015-10-30 DIAGNOSIS — C61 Malignant neoplasm of prostate: Secondary | ICD-10-CM

## 2015-10-30 NOTE — Progress Notes (Signed)
  Radiation Oncology         [phone] ________________________________  Name: Tony Leon MRN: [id number]  Date: 10/30/2015  DOB: Aug 02, 1965  SIMULATION AND TREATMENT PLANNING NOTE    ICD-9-CM ICD-10-CM   1. Recurrent prostate carcinoma ([place]) 185 C61     DIAGNOSIS: PSA recurrent prostate cancer  NARRATIVE:  The patient was brought to the [facility].  Identity was confirmed.  All relevant records and images related to the planned course of therapy were reviewed.  The patient freely provided informed written consent to proceed with treatment after reviewing the details related to the planned course of therapy. The consent form was witnessed and verified by the simulation staff.  Then, the patient was set-up in a stable reproducible  supine position for radiation therapy.  CT images were obtained.  Surface markings were placed.  The CT images were loaded into the planning software.  Then the target and avoidance structures were contoured.  Treatment planning then occurred.  The radiation prescription was entered and confirmed.  Then, I designed and supervised the construction of a total of 1 medically necessary complex treatment devices.  I have requested : Intensity Modulated Radiotherapy (IMRT) is medically necessary for this case for the following reason:  Rectal sparing..  I have ordered: dose calc  PLAN:  The patient will receive 68.4 Gy in 38 fractions.  -----------------------------------  Blair Promise, PhD, MD  This document serves as a record of services personally performed by Gery Pray, MD. It was created on his behalf by Darcus Austin, a trained medical scribe. The creation of this record is based on the scribe's personal observations and the provider's statements to them. This document has been checked and approved by the attending provider.

## 2015-11-06 DIAGNOSIS — Z510 Encounter for antineoplastic radiation therapy: Secondary | ICD-10-CM | POA: Diagnosis not present

## 2015-11-08 ENCOUNTER — Ambulatory Visit
Admission: RE | Admit: 2015-11-08 | Discharge: 2015-11-08 | Disposition: A | Discharge status: Home / Self Care | Payer: BLUE CROSS/BLUE SHIELD | Source: Ambulatory Visit | Attending: Radiation Oncology | Admitting: Radiation Oncology

## 2015-11-08 DIAGNOSIS — Z510 Encounter for antineoplastic radiation therapy: Secondary | ICD-10-CM | POA: Diagnosis not present

## 2015-11-08 DIAGNOSIS — C61 Malignant neoplasm of prostate: Secondary | ICD-10-CM

## 2015-11-08 NOTE — Progress Notes (Signed)
  Radiation Oncology         (336) [phone] ________________________________  Name: Tony Leon MRN: [id number]  Date: 11/08/2015  DOB: Sep 29, 1965  Simulation Verification Note    ICD-9-CM ICD-10-CM   1. 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 The clinical setup was verified. Then port films were obtained and uploaded to the radiation oncology medical record software.  The treatment beams were carefully compared against the planned radiation fields. The position location and shape of the radiation fields was reviewed. They targeted volume of tissue appears to be appropriately covered by the radiation beams. Organs at risk appear to be excluded as planned.  Based on my personal review, I approved the simulation verification. The patient's treatment will proceed as planned.  -----------------------------------  Blair Promise, PhD, MD

## 2015-11-09 ENCOUNTER — Ambulatory Visit
Admission: RE | Admit: 2015-11-09 | Discharge: 2015-11-09 | Disposition: A | Discharge status: Home / Self Care | Payer: BLUE CROSS/BLUE SHIELD | Source: Ambulatory Visit | Attending: Radiation Oncology | Admitting: Radiation Oncology

## 2015-11-09 DIAGNOSIS — Z510 Encounter for antineoplastic radiation therapy: Secondary | ICD-10-CM | POA: Diagnosis not present

## 2015-11-12 ENCOUNTER — Encounter: Payer: Self-pay | Admitting: Medical Oncology

## 2015-11-12 ENCOUNTER — Ambulatory Visit
Admission: RE | Admit: 2015-11-12 | Discharge: 2015-11-12 | Disposition: A | Discharge status: Home / Self Care | Payer: BLUE CROSS/BLUE SHIELD | Source: Ambulatory Visit | Attending: Radiation Oncology | Admitting: Radiation Oncology

## 2015-11-12 DIAGNOSIS — Z510 Encounter for antineoplastic radiation therapy: Secondary | ICD-10-CM | POA: Diagnosis not present

## 2015-11-12 NOTE — Progress Notes (Signed)
Oncology Nurse Navigator Documentation  Oncology Nurse Navigator Flowsheets 11/12/2015  Navigator Location [facility]  Navigator Encounter Type Treatment  Abnormal Finding Date 09/27/2015  Treatment Initiated Date 11/08/2015  Patient Visit Type RadOnc  Treatment Phase Treatment  Barriers/Navigation Needs Financial-Mr. Deeds states that he is using a lot more gas having to come to [place] daily for radiation. Last week the therapist referred him to [place] in the business office regarding gas card.   Interventions Other  Support Groups/Services Friends and Family  Acuity Level 1

## 2015-11-13 ENCOUNTER — Encounter: Payer: Self-pay | Admitting: Radiation Oncology

## 2015-11-13 ENCOUNTER — Ambulatory Visit
Admission: RE | Admit: 2015-11-13 | Discharge: 2015-11-13 | Disposition: A | Discharge status: Home / Self Care | Payer: BLUE CROSS/BLUE SHIELD | Source: Ambulatory Visit | Attending: Radiation Oncology | Admitting: Radiation Oncology

## 2015-11-13 VITALS — BP 133/88 | HR 82 | Temp 98.4°F | Ht 70.0 in | Wt 234.9 lb

## 2015-11-13 DIAGNOSIS — C61 Malignant neoplasm of prostate: Secondary | ICD-10-CM

## 2015-11-13 DIAGNOSIS — Z510 Encounter for antineoplastic radiation therapy: Secondary | ICD-10-CM | POA: Diagnosis not present

## 2015-11-13 NOTE — Progress Notes (Signed)
Jarrell Klinker has completed 3 fractions to his prostate.  He denies having pain, dysuria, hematuria or fatigue.  He does report the infection in his left ear has come back.  He was taking bactrim and stopped taking it.  He said he had 3 tablets left and started taking them again yesterday.  BP 133/88 (BP Location: Right Arm, Patient Position: Sitting)   Pulse 82   Temp 98.4 F (36.9 C) (Oral)   Ht 5\' 10"  (1.778 m)   Wt 234 lb 14.4 oz (106.5 kg)   SpO2 99%   BMI 33.70 kg/m    Wt Readings from Last 3 Encounters:  11/13/15 234 lb 14.4 oz (106.5 kg)  10/22/15 227 lb 8 oz (103.2 kg)  12/15/12 260 lb 3.8 oz (118 kg)

## 2015-11-13 NOTE — Progress Notes (Signed)
  Radiation Oncology         [phone] ________________________________  Name: ALIK HEINKE MRN: [id number]  Date: 11/13/2015  DOB: [birth year]  Weekly Radiation Therapy Management    ICD-9-CM ICD-10-CM   1. 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 . . . . . . . The patient presents for routine under treatment assessment.                        Ignatius Wirts has completed 3 fractions to his prostate.  He denies having pain, dysuria, hematuria, or fatigue.  He does report the infection in his left ear has come back.  He was taking bactrim and stopped taking it.  He said he had 3 tablets left and started taking them again yesterday.                                 Set-up films were reviewed.                                 The chart was checked. Physical Findings. . .  height is 5\' 10"  (1.778 m) and weight is 234 lb 14.4 oz (106.5 kg). His oral temperature is 98.4 F (36.9 C). His blood pressure is 133/88 and his pulse is 82. His oxygen saturation is 99%.   Lungs are clear to auscultation bilaterally. Heart has regular rate and rhythm. Abdomen soft, non-tender, normal bowel sounds. Impression . . . . . . . The patient is tolerating radiation. Plan . . . . . . . . . . . . Continue treatment as planned. ________________________________   Blair Promise, PhD, MD  This document serves as a record of services personally performed by Gery Pray, MD. It was created on his behalf by Darcus Austin, a trained medical scribe. The creation of this record is based on the scribe's personal observations and the provider's statements to them. This document has been checked and approved by the attending provider.

## 2015-11-13 NOTE — Progress Notes (Signed)
Pt here for patient teaching.  Pt given Radiation and You booklet. Pt reports they have not watched the Radiation Therapy Education video and has been given the link to watch it at home.  Reviewed areas of pertinence such as diarrhea, fatigue, hair loss, skin changes and urinary and bladder changes . Pt able to give teach back of to pat skin, use unscented/gentle soap, use baby wipes, have Imodium on hand, drink plenty of water and sitz bath,avoid applying anything to skin within 4 hours of treatment. Pt demonstrated understanding and verbalizes understanding of information given and will contact nursing with any questions or concerns.

## 2015-11-14 ENCOUNTER — Ambulatory Visit
Admission: RE | Admit: 2015-11-14 | Discharge: 2015-11-14 | Disposition: A | Discharge status: Home / Self Care | Payer: BLUE CROSS/BLUE SHIELD | Source: Ambulatory Visit | Attending: Radiation Oncology | Admitting: Radiation Oncology

## 2015-11-14 DIAGNOSIS — Z510 Encounter for antineoplastic radiation therapy: Secondary | ICD-10-CM | POA: Diagnosis not present

## 2015-11-15 ENCOUNTER — Ambulatory Visit
Admission: RE | Admit: 2015-11-15 | Discharge: 2015-11-15 | Disposition: A | Discharge status: Home / Self Care | Payer: BLUE CROSS/BLUE SHIELD | Source: Ambulatory Visit | Attending: Radiation Oncology | Admitting: Radiation Oncology

## 2015-11-15 DIAGNOSIS — Z510 Encounter for antineoplastic radiation therapy: Secondary | ICD-10-CM | POA: Diagnosis not present

## 2015-11-16 ENCOUNTER — Ambulatory Visit
Admission: RE | Admit: 2015-11-16 | Discharge: 2015-11-16 | Disposition: A | Discharge status: Home / Self Care | Payer: BLUE CROSS/BLUE SHIELD | Source: Ambulatory Visit | Attending: Radiation Oncology | Admitting: Radiation Oncology

## 2015-11-16 DIAGNOSIS — Z510 Encounter for antineoplastic radiation therapy: Secondary | ICD-10-CM | POA: Diagnosis not present

## 2015-11-19 ENCOUNTER — Ambulatory Visit
Admission: RE | Admit: 2015-11-19 | Discharge: 2015-11-19 | Disposition: A | Discharge status: Home / Self Care | Payer: BLUE CROSS/BLUE SHIELD | Source: Ambulatory Visit | Attending: Radiation Oncology | Admitting: Radiation Oncology

## 2015-11-19 DIAGNOSIS — Z510 Encounter for antineoplastic radiation therapy: Secondary | ICD-10-CM | POA: Diagnosis not present

## 2015-11-20 ENCOUNTER — Encounter: Payer: Self-pay | Admitting: Radiation Oncology

## 2015-11-20 ENCOUNTER — Ambulatory Visit
Admission: RE | Admit: 2015-11-20 | Discharge: 2015-11-20 | Disposition: A | Discharge status: Home / Self Care | Payer: BLUE CROSS/BLUE SHIELD | Source: Ambulatory Visit | Attending: Radiation Oncology | Admitting: Radiation Oncology

## 2015-11-20 VITALS — BP 127/83 | HR 82 | Temp 98.7°F | Ht 70.0 in | Wt 237.3 lb

## 2015-11-20 DIAGNOSIS — Z510 Encounter for antineoplastic radiation therapy: Secondary | ICD-10-CM | POA: Diagnosis not present

## 2015-11-20 DIAGNOSIS — C61 Malignant neoplasm of prostate: Secondary | ICD-10-CM

## 2015-11-20 NOTE — Progress Notes (Signed)
  Radiation Oncology         [phone] ________________________________  Name: Tony Leon MRN: [id number]  Date: 11/20/2015  DOB: 31-Mar-1966  Weekly Radiation Therapy Management    ICD-9-CM ICD-10-CM   1. Recurrent prostate carcinoma (HCC) 185 C61      Current Dose: 16.2 Gy     Planned Dose:  68.4 Gy  Narrative . . . . . . . . The patient presents for routine under treatment assessment.                        Tony Leon has completed 9 fractions to his prostate.  He denies having pain, dysuria, hematuria, urinary frequency, diarrhea, or skin irritation.  He reports the treatment area is slightly warmer to the touch.  He reports having fatigue.  He is requesting a refill on Bactrim for his left ear which is sensitive to the touch and is itching. He reports having an ear staph infection approximately 6 weeks ago. He states the medication was given by his PCP in [place].                                 Set-up films were reviewed.                                 The chart was checked. Physical Findings. . .  height is 5\' 10"  (1.778 m) and weight is 237 lb 4.8 oz (107.6 kg). His oral temperature is 98.7 F (37.1 C). His blood pressure is 127/83 and his pulse is 82. His oxygen saturation is 97%.   Both tympanic membranes were benign. No significant erythema in the canals. The patient is not tender in the left ear canal. Impression . . . . . . . The patient is tolerating radiation. Plan . . . . . . . . . . . . Continue treatment as planned. I advised the patient to observe any changes to his left ear; such as drainage or erythema. ________________________________   Blair Promise, PhD, MD  This document serves as a record of services personally performed by Gery Pray, MD. It was created on his behalf by Darcus Austin, a trained medical scribe. The creation of this record is based on the scribe's personal observations and the provider's statements to them. This document has been checked  and approved by the attending provider.

## 2015-11-20 NOTE — Progress Notes (Signed)
Alston Badgett has completed 9 fractions to his prostate.  He denies having pain, dysuria, hematuria, urinary frequency, diarrhea and skin irritation.  He reports the treatment area is slightly warmer to the touch.  He reports having fatigue.  He is requesting a refill on Bactrim for his right ear which is sensitive to the tough and is itching.  BP 127/83 (BP Location: Right Arm, Patient Position: Sitting)   Pulse 82   Temp 98.7 F (37.1 C) (Oral)   Ht 5\' 10"  (1.778 m)   Wt 237 lb 4.8 oz (107.6 kg)   SpO2 97%   BMI 34.05 kg/m    Wt Readings from Last 3 Encounters:  11/20/15 237 lb 4.8 oz (107.6 kg)  11/13/15 234 lb 14.4 oz (106.5 kg)  10/22/15 227 lb 8 oz (103.2 kg)

## 2015-11-21 ENCOUNTER — Ambulatory Visit
Admission: RE | Admit: 2015-11-21 | Discharge: 2015-11-21 | Disposition: A | Discharge status: Home / Self Care | Payer: BLUE CROSS/BLUE SHIELD | Source: Ambulatory Visit | Attending: Radiation Oncology | Admitting: Radiation Oncology

## 2015-11-21 DIAGNOSIS — Z510 Encounter for antineoplastic radiation therapy: Secondary | ICD-10-CM | POA: Diagnosis not present

## 2015-11-22 ENCOUNTER — Ambulatory Visit
Admission: RE | Admit: 2015-11-22 | Discharge: 2015-11-22 | Disposition: A | Discharge status: Home / Self Care | Payer: BLUE CROSS/BLUE SHIELD | Source: Ambulatory Visit | Attending: Radiation Oncology | Admitting: Radiation Oncology

## 2015-11-22 DIAGNOSIS — Z510 Encounter for antineoplastic radiation therapy: Secondary | ICD-10-CM | POA: Diagnosis not present

## 2015-11-23 ENCOUNTER — Ambulatory Visit
Admission: RE | Admit: 2015-11-23 | Discharge: 2015-11-23 | Disposition: A | Discharge status: Home / Self Care | Payer: BLUE CROSS/BLUE SHIELD | Source: Ambulatory Visit | Attending: Radiation Oncology | Admitting: Radiation Oncology

## 2015-11-23 DIAGNOSIS — Z510 Encounter for antineoplastic radiation therapy: Secondary | ICD-10-CM | POA: Diagnosis not present

## 2015-11-26 ENCOUNTER — Ambulatory Visit
Admission: RE | Admit: 2015-11-26 | Discharge: 2015-11-26 | Disposition: A | Discharge status: Home / Self Care | Payer: BLUE CROSS/BLUE SHIELD | Source: Ambulatory Visit | Attending: Radiation Oncology | Admitting: Radiation Oncology

## 2015-11-26 DIAGNOSIS — Z510 Encounter for antineoplastic radiation therapy: Secondary | ICD-10-CM | POA: Diagnosis not present

## 2015-11-27 ENCOUNTER — Ambulatory Visit
Admission: RE | Admit: 2015-11-27 | Discharge: 2015-11-27 | Disposition: A | Discharge status: Home / Self Care | Payer: BLUE CROSS/BLUE SHIELD | Source: Ambulatory Visit | Attending: Radiation Oncology | Admitting: Radiation Oncology

## 2015-11-27 ENCOUNTER — Encounter: Payer: Self-pay | Admitting: Radiation Oncology

## 2015-11-27 VITALS — BP 138/83 | HR 100 | Temp 98.9°F | Ht 70.0 in | Wt 237.2 lb

## 2015-11-27 DIAGNOSIS — Z510 Encounter for antineoplastic radiation therapy: Secondary | ICD-10-CM | POA: Diagnosis not present

## 2015-11-27 DIAGNOSIS — C61 Malignant neoplasm of prostate: Secondary | ICD-10-CM

## 2015-11-27 NOTE — Progress Notes (Signed)
  Radiation Oncology         [phone]) [phone] ________________________________  Name: Tony Leon MRN: [id number]  Date: 11/27/2015  DOB: [birth year]   Weekly Radiation Therapy Management  Recurrent prostate carcinoma    ICD-9-CM ICD-10-CM   1. Recurrent prostate carcinoma (HCC) 185 C61      Current Dose: 25.2 Gy     Planned Dose:  68.4 Gy  Narrative . . . . . . . . The patient presents for routine under treatment assessment.                         Tony Leon has completed 14 fractions to his prostate.  He reports having more urinary frequency and says that when he urinates, it feels like his urine is thicker and it takes more effort to urinate.  He reports he has some soreness after he urinates.  He reports having nocturia x 2.  He denies having diarrhea or fatigue.  He is also wondering if he can get refills on his omeprazole, lisinopril and metformin.                                  Set-up films were reviewed.                                 The chart was checked. Physical Findings. . .  height is 5\' 10"  (1.778 m) and weight is 237 lb 3.2 oz (107.6 kg). His oral temperature is 98.9 F (37.2 C). His blood pressure is 138/83 and his pulse is 100. His oxygen saturation is 96%.   Lungs are clear to auscultation bilaterally. Heart has regular rate and rhythm. No palpable cervical, supraclavicular, or axillary adenopathy. Abdomen soft, non-tender, normal bowel sounds.  Impression . . . . . . . The patient is tolerating radiation. Plan . . . . . . . . . . . . Continue treatment as planned. I instructed to the patient that if he wanted to get his blood pressure and diabetes medication refilled, he would need to do this through his GP's office as She will need to check blood work concerning his diabetes. ________________________________   Blair Promise, PhD, MD  This document serves as a record of services personally performed by Gery Pray, MD. It was created on his behalf by Truddie Hidden, a trained medical scribe. The creation of this record is based on the scribe's personal observations and the provider's statements to them. This document has been checked and approved by the attending provider.

## 2015-11-27 NOTE — Progress Notes (Signed)
Tony Leon has completed 14 fractions to his prostate.  He reports having more urinary frequency and says that when he urinates, it feels like his urine is thicker and it takes more effort to urinate.  He reports he has some soreness after he urinates.  He reports having nocturia x 2.  He denies having diarrhea or fatigue.  He is also wondering if he can get refills on his omeprazole, lisinopril and metformin.  BP 138/83 (BP Location: Right Arm, Patient Position: Sitting)   Pulse 100   Temp 98.9 F (37.2 C) (Oral)   Ht 5\' 10"  (1.778 m)   Wt 237 lb 3.2 oz (107.6 kg)   SpO2 96%   BMI 34.03 kg/m    Wt Readings from Last 3 Encounters:  11/27/15 237 lb 3.2 oz (107.6 kg)  11/20/15 237 lb 4.8 oz (107.6 kg)  11/13/15 234 lb 14.4 oz (106.5 kg)

## 2015-11-28 ENCOUNTER — Ambulatory Visit
Admission: RE | Admit: 2015-11-28 | Discharge: 2015-11-28 | Disposition: A | Discharge status: Home / Self Care | Payer: BLUE CROSS/BLUE SHIELD | Source: Ambulatory Visit | Attending: Radiation Oncology | Admitting: Radiation Oncology

## 2015-11-28 DIAGNOSIS — Z510 Encounter for antineoplastic radiation therapy: Secondary | ICD-10-CM | POA: Diagnosis not present

## 2015-11-29 ENCOUNTER — Ambulatory Visit
Admission: RE | Admit: 2015-11-29 | Discharge: 2015-11-29 | Disposition: A | Discharge status: Home / Self Care | Payer: BLUE CROSS/BLUE SHIELD | Source: Ambulatory Visit | Attending: Radiation Oncology | Admitting: Radiation Oncology

## 2015-11-29 DIAGNOSIS — Z510 Encounter for antineoplastic radiation therapy: Secondary | ICD-10-CM | POA: Diagnosis not present

## 2015-11-30 ENCOUNTER — Ambulatory Visit
Admission: RE | Admit: 2015-11-30 | Discharge: 2015-11-30 | Disposition: A | Discharge status: Home / Self Care | Payer: BLUE CROSS/BLUE SHIELD | Source: Ambulatory Visit | Attending: Radiation Oncology | Admitting: Radiation Oncology

## 2015-11-30 DIAGNOSIS — Z510 Encounter for antineoplastic radiation therapy: Secondary | ICD-10-CM | POA: Diagnosis not present

## 2015-12-03 ENCOUNTER — Ambulatory Visit
Admission: RE | Admit: 2015-12-03 | Discharge: 2015-12-03 | Disposition: A | Discharge status: Home / Self Care | Payer: BLUE CROSS/BLUE SHIELD | Source: Ambulatory Visit | Attending: Radiation Oncology | Admitting: Radiation Oncology

## 2015-12-03 DIAGNOSIS — Z510 Encounter for antineoplastic radiation therapy: Secondary | ICD-10-CM | POA: Diagnosis not present

## 2015-12-04 ENCOUNTER — Ambulatory Visit
Admission: RE | Admit: 2015-12-04 | Discharge: 2015-12-04 | Disposition: A | Discharge status: Home / Self Care | Payer: BLUE CROSS/BLUE SHIELD | Source: Ambulatory Visit | Attending: Radiation Oncology | Admitting: Radiation Oncology

## 2015-12-04 ENCOUNTER — Encounter: Payer: Self-pay | Admitting: Radiation Oncology

## 2015-12-04 ENCOUNTER — Encounter: Payer: Self-pay | Admitting: Medical Oncology

## 2015-12-04 VITALS — BP 118/73 | HR 84 | Temp 98.1°F | Ht 70.0 in | Wt 242.8 lb

## 2015-12-04 DIAGNOSIS — C61 Malignant neoplasm of prostate: Secondary | ICD-10-CM

## 2015-12-04 DIAGNOSIS — Z510 Encounter for antineoplastic radiation therapy: Secondary | ICD-10-CM | POA: Diagnosis not present

## 2015-12-04 NOTE — Progress Notes (Signed)
  Radiation Oncology         (336) [phone] ________________________________  Name: Tony Leon MRN: [id number]  Date: 12/04/2015  DOB: 1965-07-30   Weekly Radiation Therapy Management  Recurrent prostate carcinoma    ICD-9-CM ICD-10-CM   1. Recurrent prostate carcinoma ([facility]CC) 185 C61      Current Dose: 34.2 Gy     Planned Dose:  68.4 Gy  Narrative . . . . . . . . The patient presents for routine under treatment assessment.                         Tony Leon  has completed 19 fractions to his prostate.  He reports having more urgency.  He also reports having some burning which is not worse that last week.  He reports nocturia 1-2.  He reports his urinary stream is not as strong.  He denies having diarrhea.  He does report having fatigue. The patient reports that he believe he is not totally emptying his bladder when he urinates.  He reports that he is more fatigued than usual, and is experiencing some muscle aches.                                     Set-up films were reviewed.                                 The chart was checked. Physical Findings. . .  height is 5\' 10"  (1.778 m) and weight is 242 lb 12.8 oz (110.1 kg). His oral temperature is 98.1 F (36.7 C). His blood pressure is 118/73 and his pulse is 84. His oxygen saturation is 98%.   Lungs are clear to auscultation bilaterally. Heart has regular rate and rhythm. No palpable cervical, supraclavicular, or axillary adenopathy. Abdomen soft, non-tender, normal bowel sounds.  Impression . . . . . . . The patient is tolerating radiation. 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 . . . . . . . . . . . Continue treatment as planned.  ________________________________   Blair Promise, PhD, MD  This document serves as a record of services personally performed by Gery Pray, MD. It was created on his behalf by Truddie Hidden, a trained medical scribe. The creation of this record is based on the scribe's personal  observations and the provider's statements to them. This document has been checked and approved by the attending provider.

## 2015-12-04 NOTE — Progress Notes (Signed)
Christos Alger has completed 19 fractions to his prostate.  He repots having more urgency.  He also reports having some burning which is not worse that last week.  He reports nocturia 1-2.  He reports his urinary stream is not as strong.  He denies having diarrhea.  He does report having fatigue.  BP 118/73 (BP Location: Right Arm, Patient Position: Sitting)   Pulse 84   Temp 98.1 F (36.7 C) (Oral)   Ht 5\' 10"  (1.778 m)   Wt 242 lb 12.8 oz (110.1 kg)   SpO2 98%   BMI 34.84 kg/m   Wt Readings from Last 3 Encounters:  12/04/15 242 lb 12.8 oz (110.1 kg)  11/27/15 237 lb 3.2 oz (107.6 kg)  11/20/15 237 lb 4.8 oz (107.6 kg)

## 2015-12-04 NOTE — Progress Notes (Unsigned)
Oncology Nurse Navigator Documentation  Oncology Nurse Navigator Flowsheets 11/12/2015  Navigator Location [facility]  Navigator Encounter Type Treatment  Abnormal Finding Date 09/27/2015  Treatment Initiated Date 11/08/2015  Patient Visit Type RadOnc  Treatment Phase Treatment  Barriers/Navigation Needs Financial  Interventions Other  Support Groups/Services Friends and Family  Acuity Level 1  Acuity Level 1 Initial guidance, education and coordination as needed  Time Spent with Patient 30   Mr. Zins states he is doing well except the fatigue is beginning to set in. He also complains of some muscle cramping and feeling like the flu.He asked if he needed to take a day off from work could he get a note. I explained that he can get FMLA papers from his employer and  Dr. Tammi Klippel can fill out. He states if he will just write it on a prescription pad that would be ok also. He will speak with Sam regarding this matter. I encourage him to try and take rest periods and explained that after completion of radiation the fatigue will gradually begin to improve.he voiced understanding. I will continue to follow.

## 2015-12-05 ENCOUNTER — Ambulatory Visit
Admission: RE | Admit: 2015-12-05 | Discharge: 2015-12-05 | Disposition: A | Discharge status: Home / Self Care | Payer: BLUE CROSS/BLUE SHIELD | Source: Ambulatory Visit | Attending: Radiation Oncology | Admitting: Radiation Oncology

## 2015-12-05 DIAGNOSIS — Z510 Encounter for antineoplastic radiation therapy: Secondary | ICD-10-CM | POA: Diagnosis not present

## 2015-12-06 ENCOUNTER — Ambulatory Visit
Admission: RE | Admit: 2015-12-06 | Discharge: 2015-12-06 | Disposition: A | Discharge status: Home / Self Care | Payer: BLUE CROSS/BLUE SHIELD | Source: Ambulatory Visit | Attending: Radiation Oncology | Admitting: Radiation Oncology

## 2015-12-06 DIAGNOSIS — Z510 Encounter for antineoplastic radiation therapy: Secondary | ICD-10-CM | POA: Diagnosis not present

## 2015-12-07 ENCOUNTER — Ambulatory Visit
Admission: RE | Admit: 2015-12-07 | Discharge: 2015-12-07 | Disposition: A | Discharge status: Home / Self Care | Payer: BLUE CROSS/BLUE SHIELD | Source: Ambulatory Visit | Attending: Radiation Oncology | Admitting: Radiation Oncology

## 2015-12-07 DIAGNOSIS — Z510 Encounter for antineoplastic radiation therapy: Secondary | ICD-10-CM | POA: Diagnosis not present

## 2015-12-11 ENCOUNTER — Ambulatory Visit
Admission: RE | Admit: 2015-12-11 | Discharge: 2015-12-11 | Disposition: A | Discharge status: Home / Self Care | Payer: BLUE CROSS/BLUE SHIELD | Source: Ambulatory Visit | Attending: Radiation Oncology | Admitting: Radiation Oncology

## 2015-12-11 ENCOUNTER — Encounter: Payer: Self-pay | Admitting: Radiation Oncology

## 2015-12-11 VITALS — BP 124/79 | HR 95 | Temp 98.5°F | Ht 70.0 in | Wt 242.0 lb

## 2015-12-11 DIAGNOSIS — Z510 Encounter for antineoplastic radiation therapy: Secondary | ICD-10-CM | POA: Diagnosis not present

## 2015-12-11 DIAGNOSIS — C61 Malignant neoplasm of prostate: Secondary | ICD-10-CM

## 2015-12-11 NOTE — Progress Notes (Signed)
  Radiation Oncology         [phone] ________________________________  Name: Tony Leon MRN: [id number]  Date: 12/11/2015  DOB: 01-08-1966   Weekly Radiation Therapy Management  Recurrent prostate carcinoma    ICD-9-CM ICD-10-CM   1. Recurrent prostate carcinoma (HCC) 185 C61      Current Dose: 41.4 Gy     Planned Dose:  68.4 Gy  Narrative . . . . . . . . The patient presents for routine under treatment assessment.                         Tony Leon has completed 23 fractions to his prostate.  He reports having dysuria and urinates small amounts at a time.  He reports having noctuira 0-1 times per night.  He reports having loose stools 3 - 4 times daily.  He has not had to take Imodium yet.  He reports having fatigue on and off. He continues to work.                                 Set-up films were reviewed.                                 The chart was checked. Physical Findings. . .  height is 5\' 10"  (1.778 m) and weight is 242 lb (109.8 kg). His oral temperature is 98.5 F (36.9 C). His blood pressure is 124/79 and his pulse is 95. His oxygen saturation is 99%.   Lungs are clear to auscultation bilaterally. Heart has regular rate and rhythm. Abdomen soft, non-tender, normal bowel sounds. Impression . . . . . . . The patient is tolerating radiation. 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 . . . . . . . . . . . Continue treatment as planned. He is not requesting any medication for his dysuria at this time. He states it clears up towards the end of the day with more fluid intake. ________________________________   Blair Promise, PhD, MD  This document serves as a record of services personally performed by Gery Pray, MD. It was created on his behalf by Darcus Austin, a trained medical scribe. The creation of this record is based on the scribe's personal observations and the provider's statements to them. This document has been checked and approved by the attending  provider.

## 2015-12-11 NOTE — Progress Notes (Signed)
Tony Leon has completed 23 fractions to his prostate.  He denies having pain.  He reports having dysuria and urinates small amounts.  He reports having noctuira 0-1 times per night.  He reports having loose stools 3 - 4 times daily.  He has not had to take Imodium yet.  He reports having fatigue on and off.  BP 124/79 (BP Location: Right Arm, Patient Position: Sitting)   Pulse 95   Temp 98.5 F (36.9 C) (Oral)   Ht 5\' 10"  (1.778 m)   Wt 242 lb (109.8 kg)   SpO2 99%   BMI 34.72 kg/m    Wt Readings from Last 3 Encounters:  12/11/15 242 lb (109.8 kg)  12/04/15 242 lb 12.8 oz (110.1 kg)  11/27/15 237 lb 3.2 oz (107.6 kg)

## 2015-12-12 ENCOUNTER — Ambulatory Visit
Admission: RE | Admit: 2015-12-12 | Discharge: 2015-12-12 | Disposition: A | Discharge status: Home / Self Care | Payer: BLUE CROSS/BLUE SHIELD | Source: Ambulatory Visit | Attending: Radiation Oncology | Admitting: Radiation Oncology

## 2015-12-12 DIAGNOSIS — Z510 Encounter for antineoplastic radiation therapy: Secondary | ICD-10-CM | POA: Diagnosis not present

## 2015-12-13 ENCOUNTER — Ambulatory Visit
Admission: RE | Admit: 2015-12-13 | Discharge: 2015-12-13 | Disposition: A | Discharge status: Home / Self Care | Payer: BLUE CROSS/BLUE SHIELD | Source: Ambulatory Visit | Attending: Radiation Oncology | Admitting: Radiation Oncology

## 2015-12-13 ENCOUNTER — Encounter: Payer: Self-pay | Admitting: Medical Oncology

## 2015-12-13 DIAGNOSIS — Z510 Encounter for antineoplastic radiation therapy: Secondary | ICD-10-CM | POA: Diagnosis not present

## 2015-12-13 NOTE — Progress Notes (Signed)
Mr. Furia is doing well with radiation. States no major issues with side effects except some loose stools. I asked if he has imodium and he states that he has not needed any yet. He states he is in a much better place mentally than when he started. He shared his feelings of devastation when his PSA began to rise. He states he is going to start working out again and taking better care of himself.

## 2015-12-14 ENCOUNTER — Ambulatory Visit
Admission: RE | Admit: 2015-12-14 | Discharge: 2015-12-14 | Disposition: A | Discharge status: Home / Self Care | Payer: BLUE CROSS/BLUE SHIELD | Source: Ambulatory Visit | Attending: Radiation Oncology | Admitting: Radiation Oncology

## 2015-12-14 DIAGNOSIS — Z510 Encounter for antineoplastic radiation therapy: Secondary | ICD-10-CM | POA: Diagnosis not present

## 2015-12-17 ENCOUNTER — Ambulatory Visit
Admission: RE | Admit: 2015-12-17 | Discharge: 2015-12-17 | Disposition: A | Discharge status: Home / Self Care | Payer: BLUE CROSS/BLUE SHIELD | Source: Ambulatory Visit | Attending: Radiation Oncology | Admitting: Radiation Oncology

## 2015-12-17 DIAGNOSIS — Z510 Encounter for antineoplastic radiation therapy: Secondary | ICD-10-CM | POA: Diagnosis not present

## 2015-12-18 ENCOUNTER — Encounter: Payer: Self-pay | Admitting: Radiation Oncology

## 2015-12-18 ENCOUNTER — Ambulatory Visit
Admission: RE | Admit: 2015-12-18 | Discharge: 2015-12-18 | Disposition: A | Discharge status: Home / Self Care | Payer: BLUE CROSS/BLUE SHIELD | Source: Ambulatory Visit | Attending: Radiation Oncology | Admitting: Radiation Oncology

## 2015-12-18 VITALS — BP 140/85 | HR 86 | Temp 98.2°F

## 2015-12-18 DIAGNOSIS — C61 Malignant neoplasm of prostate: Secondary | ICD-10-CM

## 2015-12-18 DIAGNOSIS — Z510 Encounter for antineoplastic radiation therapy: Secondary | ICD-10-CM | POA: Diagnosis not present

## 2015-12-18 NOTE — Progress Notes (Addendum)
Tony Leon has completed 28 fractions to his prostate.  He denies having pain, dysuria, hematuria and diarrhea.  He does feel like he is having more urgency especially in the mornings after drinking coffee.  He reports nocturia 0-1 times per night. He reports having fatigue today.  BP 140/85 (BP Location: Right Arm, Patient Position: Sitting)   Pulse 86   Temp 98.2 F (36.8 C) (Oral)   SpO2 99%    Wt Readings from Last 3 Encounters:  12/11/15 242 lb (109.8 kg)  12/04/15 242 lb 12.8 oz (110.1 kg)  11/27/15 237 lb 3.2 oz (107.6 kg)

## 2015-12-18 NOTE — Progress Notes (Signed)
  Radiation Oncology         (336) [phone] ________________________________  Name: Tony Leon MRN: [id number]  Date: 12/18/2015  DOB: 1966-01-17   Weekly Radiation Therapy Management  Recurrent prostate carcinoma    ICD-9-CM ICD-10-CM   1. Recurrent prostate carcinoma (HCC) 185 C61      Current Dose: 50.4 Gy     Planned Dose:  68.4 Gy  Narrative . . . . . . . . The patient presents for routine under treatment assessment.                         Tony Leon has completed 23 fractions to his prostate.  He reports having dysuria and urinates small amounts at a time.  He reports having noctuira 0-1 times per night.  He reports having loose stools 3 - 4 times daily.  He has not had to take Imodium yet.  He reports having fatigue on and off. He continues to work. The patient reports a bit of an improvement in his urinary symptoms.                                  Set-up films were reviewed.                                 The chart was checked. Physical Findings. . .  oral temperature is 98.2 F (36.8 C). His blood pressure is 140/85 and his pulse is 86. His oxygen saturation is 99%.   Lungs are clear to auscultation bilaterally. Heart has regular rate and rhythm. Abdomen soft, non-tender, normal bowel sounds. Impression . . . . . . . The patient is tolerating radiation. 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 . . . . . . . . . . . Continue treatment as planned.  ________________________________   Blair Promise, PhD, MD  This document serves as a record of services personally performed by Gery Pray, MD. It was created on his behalf by Truddie Hidden, a trained medical scribe. The creation of this record is based on the scribe's personal observations and the provider's statements to them. This document has been checked and approved by the attending provider.

## 2015-12-19 ENCOUNTER — Ambulatory Visit
Admission: RE | Admit: 2015-12-19 | Discharge: 2015-12-19 | Disposition: A | Discharge status: Home / Self Care | Payer: BLUE CROSS/BLUE SHIELD | Source: Ambulatory Visit | Attending: Radiation Oncology | Admitting: Radiation Oncology

## 2015-12-19 DIAGNOSIS — Z510 Encounter for antineoplastic radiation therapy: Secondary | ICD-10-CM | POA: Diagnosis not present

## 2015-12-20 ENCOUNTER — Encounter: Payer: Self-pay | Admitting: Medical Oncology

## 2015-12-20 ENCOUNTER — Ambulatory Visit
Admission: RE | Admit: 2015-12-20 | Discharge: 2015-12-20 | Disposition: A | Discharge status: Home / Self Care | Payer: BLUE CROSS/BLUE SHIELD | Source: Ambulatory Visit | Attending: Radiation Oncology | Admitting: Radiation Oncology

## 2015-12-20 DIAGNOSIS — Z510 Encounter for antineoplastic radiation therapy: Secondary | ICD-10-CM | POA: Diagnosis not present

## 2015-12-21 ENCOUNTER — Ambulatory Visit: Payer: BLUE CROSS/BLUE SHIELD

## 2015-12-24 ENCOUNTER — Ambulatory Visit: Admission: RE | Admit: 2015-12-24 | Payer: BLUE CROSS/BLUE SHIELD | Source: Ambulatory Visit

## 2015-12-25 ENCOUNTER — Ambulatory Visit
Admission: RE | Admit: 2015-12-25 | Discharge: 2015-12-25 | Disposition: A | Discharge status: Home / Self Care | Payer: BLUE CROSS/BLUE SHIELD | Source: Ambulatory Visit | Attending: Radiation Oncology | Admitting: Radiation Oncology

## 2015-12-25 ENCOUNTER — Encounter: Payer: Self-pay | Admitting: Radiation Oncology

## 2015-12-25 VITALS — BP 125/63 | HR 90 | Temp 98.2°F | Resp 20 | Ht 70.0 in | Wt 245.8 lb

## 2015-12-25 DIAGNOSIS — C61 Malignant neoplasm of prostate: Secondary | ICD-10-CM

## 2015-12-25 DIAGNOSIS — Z510 Encounter for antineoplastic radiation therapy: Secondary | ICD-10-CM | POA: Diagnosis not present

## 2015-12-25 NOTE — Progress Notes (Signed)
Tony Leon has completed 31 fractions to his prostate.  Having pain in testicles for the last few days 2/10, dysuria, hematuria and diarrhea.  He does feel like he is having more urgency especially in the mornings after drinking coffee.  He reports nocturia 0-1 times per night. He reports having fatigue ongoing. Wt Readings from Last 3 Encounters:  12/25/15 245 lb 12.8 oz (111.5 kg)  12/11/15 242 lb (109.8 kg)  12/04/15 242 lb 12.8 oz (110.1 kg)  BP 125/63 (BP Location: Right Arm, Patient Position: Sitting, Cuff Size: Normal)   Pulse 90   Temp 98.2 F (36.8 C) (Oral)   Resp 20   Ht 5\' 10"  (1.778 m)   Wt 245 lb 12.8 oz (111.5 kg)   SpO2 98%   BMI 35.27 kg/m

## 2015-12-25 NOTE — Progress Notes (Signed)
  Radiation Oncology         (336) [phone] ________________________________  Name: Tony Leon MRN: [id number]  Date: 12/25/2015  DOB: [birth year]   Weekly Radiation Therapy Management  Recurrent prostate carcinoma    ICD-9-CM ICD-10-CM   1. Recurrent prostate carcinoma ([facility]CC) 185 C61      Current Dose: 55.8 Gy     Planned Dose:  68.4 Gy  Narrative . . . . . . . . The patient presents for routine under treatment assessment. Tony Leon has completed 31 fractions to his prostate.  Having pain in testicles for the last few days 2/10,denies dysuria, hematuria and diarrhea. He reports that these symptoms have not worsened.  He does feel like he is having more urgency especially in the mornings after drinking coffee.  He reports nocturia 0-1 times per night. He reports having fatigue ongoing. Patient denies skin irritation along the scrotum.                                                            Set-up films were reviewed.                                 The chart was checked. Physical Findings. . .  height is 5\' 10"  (1.778 m) and weight is 245 lb 12.8 oz (111.5 kg). His oral temperature is 98.2 F (36.8 C). His blood pressure is 125/63 and his pulse is 90. His respiration is 20 and oxygen saturation is 98%.   Lungs are clear to auscultation bilaterally. Heart has regular rate and rhythm. Abdomen soft, non-tender, normal bowel sounds. Impression . . . . . . . The patient is tolerating radiation. 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 . . . . . . . . . . . Continue treatment as planned.  ________________________________   Blair Promise, PhD, MD  This document serves as a record of services personally performed by Gery Pray, MD. It was created on his behalf by Truddie Hidden, a trained medical scribe. The creation of this record is based on the scribe's personal observations and the provider's statements to them. This document has been checked and approved by the  attending provider.

## 2015-12-26 ENCOUNTER — Ambulatory Visit
Admission: RE | Admit: 2015-12-26 | Discharge: 2015-12-26 | Disposition: A | Discharge status: Home / Self Care | Payer: BLUE CROSS/BLUE SHIELD | Source: Ambulatory Visit | Attending: Radiation Oncology | Admitting: Radiation Oncology

## 2015-12-26 ENCOUNTER — Encounter: Payer: Self-pay | Admitting: Medical Oncology

## 2015-12-26 DIAGNOSIS — Z510 Encounter for antineoplastic radiation therapy: Secondary | ICD-10-CM | POA: Diagnosis not present

## 2015-12-26 NOTE — Progress Notes (Signed)
Mr. Tony Leon tolerating treatments well but is frustrated with waiting to be treated. He arrives early and he feels they need to work him in early. I allowed him to vent his frustrations and we discussed how the staff does make an effort to get him early if they can. He admits that they have been helpful and states  he is just tired and ready to be done with treatment. He has 6 more treatments after today and will continue to be support. He thanked me for listening and helping him get in a better mood.

## 2015-12-27 ENCOUNTER — Ambulatory Visit
Admission: RE | Admit: 2015-12-27 | Discharge: 2015-12-27 | Disposition: A | Discharge status: Home / Self Care | Payer: BLUE CROSS/BLUE SHIELD | Source: Ambulatory Visit | Attending: Radiation Oncology | Admitting: Radiation Oncology

## 2015-12-27 DIAGNOSIS — Z510 Encounter for antineoplastic radiation therapy: Secondary | ICD-10-CM | POA: Diagnosis not present

## 2015-12-28 ENCOUNTER — Ambulatory Visit
Admission: RE | Admit: 2015-12-28 | Discharge: 2015-12-28 | Disposition: A | Discharge status: Home / Self Care | Payer: BLUE CROSS/BLUE SHIELD | Source: Ambulatory Visit | Attending: Radiation Oncology | Admitting: Radiation Oncology

## 2015-12-28 DIAGNOSIS — Z510 Encounter for antineoplastic radiation therapy: Secondary | ICD-10-CM | POA: Diagnosis not present

## 2015-12-31 ENCOUNTER — Ambulatory Visit
Admission: RE | Admit: 2015-12-31 | Discharge: 2015-12-31 | Disposition: A | Discharge status: Home / Self Care | Payer: BLUE CROSS/BLUE SHIELD | Source: Ambulatory Visit | Attending: Radiation Oncology | Admitting: Radiation Oncology

## 2015-12-31 DIAGNOSIS — Z510 Encounter for antineoplastic radiation therapy: Secondary | ICD-10-CM | POA: Diagnosis not present

## 2016-01-01 ENCOUNTER — Ambulatory Visit: Payer: BLUE CROSS/BLUE SHIELD

## 2016-01-01 ENCOUNTER — Encounter: Payer: Self-pay | Admitting: Oncology

## 2016-01-01 ENCOUNTER — Encounter: Payer: Self-pay | Admitting: Radiation Oncology

## 2016-01-01 ENCOUNTER — Ambulatory Visit
Admission: RE | Admit: 2016-01-01 | Discharge: 2016-01-01 | Disposition: A | Discharge status: Home / Self Care | Payer: BLUE CROSS/BLUE SHIELD | Source: Ambulatory Visit | Attending: Radiation Oncology | Admitting: Radiation Oncology

## 2016-01-01 VITALS — BP 131/81 | HR 85 | Temp 98.2°F | Ht 70.0 in | Wt 247.7 lb

## 2016-01-01 DIAGNOSIS — C61 Malignant neoplasm of prostate: Secondary | ICD-10-CM

## 2016-01-01 DIAGNOSIS — Z510 Encounter for antineoplastic radiation therapy: Secondary | ICD-10-CM | POA: Diagnosis not present

## 2016-01-01 NOTE — Progress Notes (Signed)
  Radiation Oncology         (336) [phone] ________________________________  Name: Tony Leon MRN: [id number]  Date: 01/01/2016  DOB: Aug 04, 1965  Weekly Radiation Therapy Management    ICD-9-CM ICD-10-CM   1. Prostate cancer ([place]) 185 C61      Current Dose: 64.8 Gy     Planned Dose:  68.4 Gy  Narrative . . . . . . . . The patient presents for routine under treatment assessment.                                   [name] has completed 36 fractions to his prostate.  He reports having a cold and cough that started over the weekend.  He said he is having pain in his chest when he coughs and is coughing up green sputum.  He reports having bloody diarrhea that looked like "tomato soup" that started yesterday.  He said it has cleared  today. He did drink a fair amount of tequila the last night in the night before . He continues to report having dysuria.  He reports nocturia 1-2 times.  He has fatigue.  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  He has been given a one month follow up appointment.                                 Set-up films were reviewed.                                 The chart was checked. Physical Findings. . .  height is 5\' 10"  (1.778 m) and weight is 247 lb 11.2 oz (112.4 kg). His oral temperature is 98.2 F (36.8 C). His blood pressure is 131/81 and his pulse is 85. His oxygen saturation is 100%. . Weight essentially stable. The lungs are clear. The heart has a regular rhythm and rate. The abdomen is soft and nontender with normal bowel sounds. Impression . . . . . . . The patient is tolerating radiation. Plan . . . . . . . . . . . . Continue treatment as planned.  ________________________________   Blair Promise, PhD, MD

## 2016-01-01 NOTE — Progress Notes (Signed)
Tony Leon has completed 36 fractions to his prostate.  He reports having a cold and cough that started over the weekend.  He said he is having pain in his chest when he coughs and is coughing up green sputum.  He reports having bloody diarrhea that looked like "tamato soup" that started yesterday.  He said it has cleared it today.  He continues to report having dysuria.  He reports nocturia 1-2 times.  He has fatigue.  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  He has been given a one month follow up appointment.  BP 131/81 (BP Location: Right Arm, Patient Position: Sitting)   Pulse 85   Temp 98.2 F (36.8 C) (Oral)   Ht 5\' 10"  (1.778 m)   Wt 247 lb 11.2 oz (112.4 kg)   SpO2 100%   BMI 35.54 kg/m    Wt Readings from Last 3 Encounters:  01/01/16 247 lb 11.2 oz (112.4 kg)  12/25/15 245 lb 12.8 oz (111.5 kg)  12/11/15 242 lb (109.8 kg)

## 2016-01-02 ENCOUNTER — Ambulatory Visit: Payer: BLUE CROSS/BLUE SHIELD

## 2016-01-02 ENCOUNTER — Ambulatory Visit
Admission: RE | Admit: 2016-01-02 | Discharge: 2016-01-02 | Disposition: A | Discharge status: Home / Self Care | Payer: BLUE CROSS/BLUE SHIELD | Source: Ambulatory Visit | Attending: Radiation Oncology | Admitting: Radiation Oncology

## 2016-01-02 DIAGNOSIS — Z510 Encounter for antineoplastic radiation therapy: Secondary | ICD-10-CM | POA: Diagnosis not present

## 2016-01-03 ENCOUNTER — Encounter: Payer: Self-pay | Admitting: Radiation Oncology

## 2016-01-03 ENCOUNTER — Ambulatory Visit
Admission: RE | Admit: 2016-01-03 | Discharge: 2016-01-03 | Disposition: A | Discharge status: Home / Self Care | Payer: BLUE CROSS/BLUE SHIELD | Source: Ambulatory Visit | Attending: Radiation Oncology | Admitting: Radiation Oncology

## 2016-01-03 ENCOUNTER — Ambulatory Visit: Payer: BLUE CROSS/BLUE SHIELD

## 2016-01-03 DIAGNOSIS — Z510 Encounter for antineoplastic radiation therapy: Secondary | ICD-10-CM | POA: Diagnosis not present

## 2016-01-04 ENCOUNTER — Ambulatory Visit: Admission: RE | Admit: 2016-01-04 | Payer: BLUE CROSS/BLUE SHIELD | Source: Ambulatory Visit

## 2016-01-08 NOTE — Progress Notes (Signed)
  Radiation Oncology         [phone]) [phone] ________________________________  Name: Tony Leon MRN: [id number]  Date: 01/03/2016  DOB: 1965-09-09  End of Treatment Note  Diagnosis:    PSA recurrent prostate cancer,prostate adenocarcinoma with Gleason score of 7 (3+4), status post-prostatectomy (pT2C, pN0)  Indication for treatment:  Salvage therapy       Radiation treatment dates:   11/08/15 - 01/03/16  Site/dose:   Prostate Bed: 68.4 Gy in 38 fractions  Beams/energy:   IMRT // 6X Photon  Narrative: The patient tolerated radiation treatment relatively well. Towards the end of treatment, the patient reported testicular pain, loose stools, dysuria, and nocturia x1-2. He developed a cold at the end of treatment and started coughing up green sputum, chest pain, and bloody diarrhea that cleared up overnight.  Plan: The patient has completed radiation treatment. The patient will return to radiation oncology clinic for routine followup in one month. I advised them to call or return sooner if they have any questions or concerns related to their recovery or treatment.  -----------------------------------  Blair Promise, PhD, MD  This document serves as a record of services personally performed by Gery Pray, MD. It was created on his behalf by Darcus Austin, a trained medical scribe. The creation of this record is based on the scribe's personal observations and the provider's statements to them. This document has been checked and approved by the attending provider.

## 2016-02-18 ENCOUNTER — Encounter: Payer: Self-pay | Admitting: Oncology

## 2016-02-20 ENCOUNTER — Encounter: Payer: Self-pay | Admitting: Radiation Oncology

## 2016-02-20 ENCOUNTER — Ambulatory Visit
Admission: RE | Admit: 2016-02-20 | Discharge: 2016-02-20 | Disposition: A | Discharge status: Home / Self Care | Payer: BLUE CROSS/BLUE SHIELD | Source: Ambulatory Visit | Attending: Radiation Oncology | Admitting: Radiation Oncology

## 2016-02-20 VITALS — BP 132/84 | HR 99 | Temp 98.4°F | Ht 70.0 in | Wt 253.0 lb

## 2016-02-20 DIAGNOSIS — Z7984 Long term (current) use of oral hypoglycemic drugs: Secondary | ICD-10-CM | POA: Diagnosis not present

## 2016-02-20 DIAGNOSIS — C61 Malignant neoplasm of prostate: Secondary | ICD-10-CM | POA: Diagnosis present

## 2016-02-20 DIAGNOSIS — Z79899 Other long term (current) drug therapy: Secondary | ICD-10-CM | POA: Diagnosis not present

## 2016-02-20 DIAGNOSIS — R300 Dysuria: Secondary | ICD-10-CM | POA: Insufficient documentation

## 2016-02-20 DIAGNOSIS — Z923 Personal history of irradiation: Secondary | ICD-10-CM | POA: Insufficient documentation

## 2016-02-20 DIAGNOSIS — Z7982 Long term (current) use of aspirin: Secondary | ICD-10-CM | POA: Diagnosis not present
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
## 2016-02-21 ENCOUNTER — Ambulatory Visit: Payer: Self-pay | Admitting: Radiation Oncology

## 2016-05-29 ENCOUNTER — Ambulatory Visit: Payer: BLUE CROSS/BLUE SHIELD | Admitting: Radiation Oncology

## 2016-07-28 ENCOUNTER — Ambulatory Visit
Admission: RE | Admit: 2016-07-28 | Discharge: 2016-07-28 | Disposition: A | Discharge status: Home / Self Care | Payer: BLUE CROSS/BLUE SHIELD | Source: Ambulatory Visit | Attending: Otolaryngology | Admitting: Otolaryngology

## 2016-07-28 ENCOUNTER — Other Ambulatory Visit: Payer: Self-pay | Admitting: Otolaryngology

## 2016-07-28 DIAGNOSIS — J329 Chronic sinusitis, unspecified: Secondary | ICD-10-CM

## 2016-10-10 ENCOUNTER — Other Ambulatory Visit: Payer: Self-pay | Admitting: Urology

## 2016-10-13 ENCOUNTER — Encounter (HOSPITAL_COMMUNITY)
Admission: RE | Admit: 2016-10-13 | Discharge: 2016-10-13 | Disposition: A | Discharge status: Home / Self Care | Payer: BLUE CROSS/BLUE SHIELD | Source: Ambulatory Visit | Attending: Urology | Admitting: Urology

## 2016-10-13 ENCOUNTER — Encounter (HOSPITAL_COMMUNITY): Payer: Self-pay

## 2016-10-13 DIAGNOSIS — Z888 Allergy status to other drugs, medicaments and biological substances status: Secondary | ICD-10-CM | POA: Diagnosis not present

## 2016-10-13 DIAGNOSIS — Z7982 Long term (current) use of aspirin: Secondary | ICD-10-CM | POA: Diagnosis not present

## 2016-10-13 DIAGNOSIS — N211 Calculus in urethra: Secondary | ICD-10-CM | POA: Diagnosis not present

## 2016-10-13 DIAGNOSIS — Z6838 Body mass index (BMI) 38.0-38.9, adult: Secondary | ICD-10-CM | POA: Diagnosis not present

## 2016-10-13 DIAGNOSIS — Z7984 Long term (current) use of oral hypoglycemic drugs: Secondary | ICD-10-CM | POA: Diagnosis not present

## 2016-10-13 DIAGNOSIS — Z79899 Other long term (current) drug therapy: Secondary | ICD-10-CM | POA: Diagnosis not present

## 2016-10-13 DIAGNOSIS — K219 Gastro-esophageal reflux disease without esophagitis: Secondary | ICD-10-CM | POA: Diagnosis not present

## 2016-10-13 DIAGNOSIS — Z923 Personal history of irradiation: Secondary | ICD-10-CM | POA: Diagnosis not present

## 2016-10-13 DIAGNOSIS — I10 Essential (primary) hypertension: Secondary | ICD-10-CM | POA: Diagnosis not present

## 2016-10-13 DIAGNOSIS — E119 Type 2 diabetes mellitus without complications: Secondary | ICD-10-CM | POA: Diagnosis not present

## 2016-10-13 DIAGNOSIS — N3289 Other specified disorders of bladder: Secondary | ICD-10-CM | POA: Diagnosis not present

## 2016-10-13 DIAGNOSIS — R310 Gross hematuria: Secondary | ICD-10-CM | POA: Diagnosis not present

## 2016-10-13 DIAGNOSIS — Z8546 Personal history of malignant neoplasm of prostate: Secondary | ICD-10-CM | POA: Diagnosis not present

## 2016-10-13 DIAGNOSIS — E669 Obesity, unspecified: Secondary | ICD-10-CM | POA: Diagnosis not present

## 2016-10-13 HISTORY — DX: Unspecified osteoarthritis, unspecified site: M19.90

## 2016-10-13 HISTORY — DX: Unspecified urinary incontinence: R32

## 2016-10-13 HISTORY — DX: Chronic obstructive pulmonary disease, unspecified: J44.9

## 2016-10-13 HISTORY — DX: Dyspnea, unspecified: R06.00

## 2016-10-13 LAB — BASIC METABOLIC PANEL
ANION GAP: 8 (ref 5–15)
BUN: 11 mg/dL (ref 6–20)
CALCIUM: 9.1 mg/dL (ref 8.9–10.3)
CO2: 27 mmol/L (ref 22–32)
Chloride: 106 mmol/L (ref 101–111)
Creatinine, Ser: 0.99 mg/dL (ref 0.61–1.24)
Glucose, Bld: 168 mg/dL — ABNORMAL HIGH (ref 65–99)
Potassium: 4.4 mmol/L (ref 3.5–5.1)
Sodium: 141 mmol/L (ref 135–145)

## 2016-10-13 LAB — CBC
HCT: 41.2 % (ref 39.0–52.0)
HEMOGLOBIN: 14.7 g/dL (ref 13.0–17.0)
MCH: 33.6 pg (ref 26.0–34.0)
MCHC: 35.7 g/dL (ref 30.0–36.0)
MCV: 94.1 fL (ref 78.0–100.0)
Platelets: 193 10*3/uL (ref 150–400)
RBC: 4.38 MIL/uL (ref 4.22–5.81)
RDW: 13 % (ref 11.5–15.5)
WBC: 5.6 10*3/uL (ref 4.0–10.5)

## 2016-10-13 LAB — GLUCOSE, CAPILLARY: GLUCOSE-CAPILLARY: 217 mg/dL — AB (ref 65–99)

## 2016-10-13 NOTE — Progress Notes (Signed)
Spoke to Dr. Marcell Barlow, Anesthesia about patient's medical history  and patient refusing to come in for a pre-op appointment for surgery.. After Dr. Marcell Barlow reviewed medical history, per Dr. Marcell Barlow " patient needs to come in for a pre-op appointment or case will be cancelled."

## 2016-10-13 NOTE — Progress Notes (Signed)
   10/13/16 1534  OBSTRUCTIVE SLEEP APNEA  Have you ever been diagnosed with sleep apnea through a sleep study? No  Do you snore loudly (loud enough to be heard through closed doors)?  1  Do you often feel tired, fatigued, or sleepy during the daytime (such as falling asleep during driving or talking to someone)? 0  Has anyone observed you stop breathing during your sleep? 0  Do you have, or are you being treated for high blood pressure? 1  BMI more than 35 kg/m2? 1  Age > 50 (1-yes) 0  Neck circumference greater than:Male 16 inches or larger, Male 17inches or larger? 1 (18 inches)  Male Gender (Yes=1) 1  Obstructive Sleep Apnea Score 5

## 2016-10-13 NOTE — Patient Instructions (Addendum)
GLOVER CAPANO  10/13/2016   Your procedure is scheduled on: Thursday 10-16-16  Report to [facility] Main  Entrance Take [place]  elevators to 3rd floor to  [place] at 800 AM.  Call this number if you have problems the morning of surgery [phone]  Remember: ONLY 1 PERSON MAY GO WITH YOU TO SHORT STAY TO GET  READY MORNING OF [place].  Do not eat food or drink liquids :After Midnight.     Take these medicines the morning of surgery with A SIP OF WATER: OMEPRAZOLE (PROTONIX), ALLERGY PILL DO NOT TAKE ANY DIABETIC MEDICATIONS DAY OF YOUR SURGERY                               You may not have any metal on your body including hair pins and              piercings  Do not wear jewelry, make-up, lotions, powders or perfumes, deodorant             Do not wear nail polish.  Do not shave  48 hours prior to surgery.              Men may shave face and neck.   Do not bring valuables to the hospital. [place].  Contacts, dentures or bridgework may not be worn into surgery.  Leave suitcase in the car. After surgery it may be brought to your room.     Patients discharged the day of surgery will not be allowed to drive home.  Name and phone number of your driver: Pieter Partridge [facility] [phone]  Special Instructions: N/A              Please read over the following fact sheets you were given: _____________________________________________________________________             How to Manage Your Diabetes Before and After Surgery     WHAT DO I DO ABOUT MY DIABETES MEDICATION?  .  THE DAY BEFORE SURGERY 10-15-16 TAKE YOUR METFORMIN AS USUAL. . THE DAY OF SURGERY   12-18 DO NOT TAKE YOUR METFORMIN.   Patient Signature:  Date:   Nurse Signature:  Date:   Reviewed and Endorsed by [facility] Patient Education Committee, August 2015[facility] - Preparing for Surgery Before surgery, you can play an  important role.  Because skin is not sterile, your skin needs to be as free of germs as possible.  You can reduce the number of germs on your skin by washing with CHG (chlorahexidine gluconate) soap before surgery.  CHG is an antiseptic cleaner which kills germs and bonds with the skin to continue killing germs even after washing. Please DO NOT use if you have an allergy to CHG or antibacterial soaps.  If your skin becomes reddened/irritated stop using the CHG and inform your nurse when you arrive at Short Stay. Do not shave (including legs and underarms) for at least 48 hours prior to the first CHG shower.  You may shave your face/neck. Please follow these instructions carefully:  1.  Shower with CHG Soap the night before surgery and the  morning of Surgery.  2.  If you choose to wash your hair, wash  your hair first as usual with your  normal  shampoo.  3.  After you shampoo, rinse your hair and body thoroughly to remove the  shampoo.                           4.  Use CHG as you would any other liquid soap.  You can apply chg directly  to the skin and wash                       Gently with a scrungie or clean washcloth.  5.  Apply the CHG Soap to your body ONLY FROM THE NECK DOWN.   Do not use on face/ open                           Wound or open sores. Avoid contact with eyes, ears mouth and genitals (private parts).                       Wash face,  Genitals (private parts) with your normal soap.             6.  Wash thoroughly, paying special attention to the area where your surgery  will be performed.  7.  Thoroughly rinse your body with warm water from the neck down.  8.  DO NOT shower/wash with your normal soap after using and rinsing off  the CHG Soap.                9.  Pat yourself dry with a clean towel.            10.  Wear clean pajamas.            11.  Place clean sheets on your bed the night of your first shower and do not  sleep with pets. Day of Surgery : Do not apply any  lotions/deodorants the morning of surgery.  Please wear clean clothes to the hospital/surgery center.

## 2016-10-14 LAB — HEMOGLOBIN A1C
Hgb A1c MFr Bld: 7.8 % — ABNORMAL HIGH (ref 4.8–5.6)
MEAN PLASMA GLUCOSE: 177 mg/dL

## 2016-10-15 MED ORDER — DEXTROSE 5 % IV SOLN
3.0000 g | INTRAVENOUS | Status: AC
  Administered 2016-10-16: 3 g via INTRAVENOUS
  Filled 2016-10-15: qty 3

## 2016-10-15 NOTE — H&P (Signed)
CC/HPI: I have blood in my urine.     Tony Leon returns today in f/u for cystoscopy to complete his hematuria evaluation. He had 2 episodes with gross hematuria and a clot about 6 weeks ago. A CT showed some bladder wall thickening but no other abnormalities. His UA today is clear. He has had a prior prostatectomy and salvage RTx which he completed on 01/03/16 for a PSA rise to 0.06 and his PSA is back down to <0.015. He had the radiation therapy in with     ALLERGIES: Losartan Potassium TABS    MEDICATIONS: Lisinopril  Metformin Hcl  Omeprazole  Oxybutynin Chloride 5 mg tablet 1 tablet PO Q HS  Claritin     GU PSH: Laparoscopy; Lymphadenectomy - 2014 Locm 300-399Mg /Ml Iodine,1Ml - 09/22/2016 Robotic Radical Prostatectomy - 2014 Ureteroscopic stone removal - 2014      PSH Notes: Laparoscopy With Bilateral Total Pelvic Lymphadenectomy, Prostatect Retropubic Radical W/ Nerve Sparing Laparoscopic, Cystoscopy With Ureteroscopy With Removal Of Calculus   NON-GU PSH: None   GU PMH: Gross hematuria, He had 2 episodes of gross hematuria and needs evaluation. I will get him set up to return for CT and cystoscopy. - 09/15/2016 History of prostate cancer (Improving), His PSA has fallen farther post RTx. - 09/15/2016, History of prostate cancer, - 02/27/2015 Prostate Cancer (Improving) - 03/07/2016, (Worsening), - 09/27/2015 Urge incontinence - 03/07/2016 ED following radical prostatectomy, Erectile dysfunction following radical prostatectomy - 02/27/2015 Urinary Urgency, Urinary urgency - 02/27/2015 Nocturnal Enuresis, Enuresis, nocturnal only - 2016 Stress Incontinence, Male stress incontinence - 2015 Elevated PSA, Elevated prostate specific antigen (PSA) - 2015 Prostate nodule w/o LUTS, Nodular prostate without lower urinary tract symptoms - 2015 History of urolithiasis, Nephrolithiasis - 2014    NON-GU PMH: Encounter for general adult medical examination without abnormal findings, Encounter  for preventive health examination - 2016 Gout, Gout - 2014 Muscle weakness (generalized), Muscle weakness - 2014 Other lack of coordination, Other lack of coordination - 2014 Personal history of other diseases of the circulatory system, History of hypertension - 2014 Personal history of other endocrine, nutritional and metabolic disease, History of diabetes mellitus - 2014    FAMILY HISTORY: Family Health Status Number - Runs In Family Prostate Cancer - Runs In Family   SOCIAL HISTORY: Marital Status: Single Current Smoking Status: Patient smokes.  Excessive Drinker.  Patient uses recreational drugs. Uses marijuana. Drinks 1 caffeinated drink per day.     Notes: Current every day smoker, Tobacco use, Exercise Habits, Activities Of Daily Living, Self-reliant In Usual Daily Activities, Living Independently, Caffeine Use, Alcohol Use, Occupation:, Marital History - Single   REVIEW OF SYSTEMS:    GU Review Male:   Patient denies frequent urination, hard to postpone urination, burning/ pain with urination, get up at night to urinate, leakage of urine, stream starts and stops, trouble starting your stream, have to strain to urinate , erection problems, and penile pain.  Gastrointestinal (Upper):   Patient denies nausea, vomiting, and indigestion/ heartburn.  Gastrointestinal (Lower):   Patient denies diarrhea and constipation.  Constitutional:   Patient denies fever, night sweats, weight loss, and fatigue.  Skin:   Patient denies skin rash/ lesion and itching.  Eyes:   Patient denies blurred vision and double vision.  Ears/ Nose/ Throat:   Patient denies sore throat and sinus problems.  Hematologic/Lymphatic:   Patient denies swollen glands and easy bruising.  Cardiovascular:   Patient denies leg swelling and chest pains.  Respiratory:   Patient denies  shortness of breath and cough.  Endocrine:   Patient denies excessive thirst.  Musculoskeletal:   Patient denies back pain and joint pain.   Neurological:   Patient denies headaches and dizziness.  Psychologic:   Patient denies depression and anxiety.   VITAL SIGNS:      10/09/2016 03:34 PM  Weight 268 lb / 121.56 kg  Height 70 in / 177.8 cm  BP 141/90 mmHg  Pulse 88 /min  Temperature 98.6 F / 37 C  BMI 38.4 kg/m   MULTI-SYSTEM PHYSICAL EXAMINATION:    Constitutional: Well-nourished. No physical deformities. Normally developed. Good grooming.  Respiratory: No labored breathing, no use of accessory muscles. CTA  Cardiovascular: Normal temperature, RRR without murmur     PAST DATA REVIEWED:  Source Of History:  Patient  Lab Test Review:   PSA  Urine Test Review:   Urinalysis   09/03/16 02/20/16 09/27/15 08/28/15 08/27/15 02/22/15 08/03/14 01/31/14  PSA  Total PSA < 0.015 ng/dl 0.025  0.06  0.05  0.05 ng/dl <0.01  <0.01  <0.01     PROCEDURES:         Flexible Cystoscopy - 52000  Risks, benefits, and some of the potential complications of the procedure were discussed. 30ml of 2% lidocaine jelly was instilled intraurethrally. Cipro 500mg  given for antibiotic prophylaxis.     Meatus:  Normal size. Normal location. Normal condition.  Urethra:  No strictures.  External Sphincter:  Normal.  Verumontanum:  Verumontanum Surgically Absent.  Prostate:  Prostate Surgically Absent.  Bladder Neck:  Non-obstructing. There is an encrusted clip at 12 o'clock at the UV anastomosis.   Ureteral Orifices:  Normal location. Normal size. Normal shape. Effluxed clear urine.  Bladder:  Mild trabeculation. No tumors. Normal mucosa. No stones.      The procedure was well tolerated and there were no complications.         Urinalysis - 81003 Dipstick Dipstick Cont'd  Color: Yellow Bilirubin: Neg  Appearance: Clear Ketones: Trace  Specific Gravity: 1.025 Blood: Neg  pH: 5.5 Protein: Neg  Glucose: Trace Urobilinogen: 0.2    Nitrites: Neg    Leukocyte Esterase: Neg    ASSESSMENT:      ICD-10 Details  1 GU:   Gross hematuria -  R31.0 He has an encrusted clip at the anastamosis that is the probable source of the bleeding. I am going to get him set up to go to the OR for removal. The risks of bleeding infection, urethral injury with stricture or worsening incontinence, thrombotic events and anesthetic complications reviewed.   2   Bladder Stone - N21.0    PLAN:           Schedule Return Visit/Planned Activity: 1-2 Weeks - Schedule Surgery

## 2016-10-16 ENCOUNTER — Ambulatory Visit (HOSPITAL_COMMUNITY): Payer: BLUE CROSS/BLUE SHIELD | Admitting: Anesthesiology

## 2016-10-16 ENCOUNTER — Ambulatory Visit (HOSPITAL_COMMUNITY)
Admission: RE | Admit: 2016-10-16 | Discharge: 2016-10-16 | Disposition: A | Discharge status: Home / Self Care | Payer: BLUE CROSS/BLUE SHIELD | Source: Ambulatory Visit | Attending: Urology | Admitting: Urology

## 2016-10-16 ENCOUNTER — Encounter (HOSPITAL_COMMUNITY): Payer: Self-pay | Admitting: Anesthesiology

## 2016-10-16 ENCOUNTER — Encounter (HOSPITAL_COMMUNITY)
Admission: RE | Disposition: A | Discharge status: Home / Self Care | Payer: Self-pay | Source: Ambulatory Visit | Attending: Urology

## 2016-10-16 DIAGNOSIS — K219 Gastro-esophageal reflux disease without esophagitis: Secondary | ICD-10-CM | POA: Insufficient documentation

## 2016-10-16 DIAGNOSIS — R310 Gross hematuria: Secondary | ICD-10-CM | POA: Insufficient documentation

## 2016-10-16 DIAGNOSIS — N211 Calculus in urethra: Secondary | ICD-10-CM | POA: Insufficient documentation

## 2016-10-16 DIAGNOSIS — E119 Type 2 diabetes mellitus without complications: Secondary | ICD-10-CM | POA: Insufficient documentation

## 2016-10-16 DIAGNOSIS — Z79899 Other long term (current) drug therapy: Secondary | ICD-10-CM | POA: Insufficient documentation

## 2016-10-16 DIAGNOSIS — Z7982 Long term (current) use of aspirin: Secondary | ICD-10-CM | POA: Insufficient documentation

## 2016-10-16 DIAGNOSIS — Z6838 Body mass index (BMI) 38.0-38.9, adult: Secondary | ICD-10-CM | POA: Insufficient documentation

## 2016-10-16 DIAGNOSIS — N3289 Other specified disorders of bladder: Secondary | ICD-10-CM | POA: Insufficient documentation

## 2016-10-16 DIAGNOSIS — Z888 Allergy status to other drugs, medicaments and biological substances status: Secondary | ICD-10-CM | POA: Insufficient documentation

## 2016-10-16 DIAGNOSIS — E669 Obesity, unspecified: Secondary | ICD-10-CM | POA: Insufficient documentation

## 2016-10-16 DIAGNOSIS — Z923 Personal history of irradiation: Secondary | ICD-10-CM | POA: Insufficient documentation

## 2016-10-16 DIAGNOSIS — Z8546 Personal history of malignant neoplasm of prostate: Secondary | ICD-10-CM | POA: Insufficient documentation

## 2016-10-16 DIAGNOSIS — I10 Essential (primary) hypertension: Secondary | ICD-10-CM | POA: Insufficient documentation

## 2016-10-16 DIAGNOSIS — Z7984 Long term (current) use of oral hypoglycemic drugs: Secondary | ICD-10-CM | POA: Insufficient documentation

## 2016-10-16 HISTORY — PX: CYSTOSCOPY: SHX5120

## 2016-10-16 LAB — GLUCOSE, CAPILLARY: GLUCOSE-CAPILLARY: 189 mg/dL — AB (ref 65–99)

## 2016-10-16 SURGERY — CYSTOSCOPY
Anesthesia: General | Site: Urethra

## 2016-10-16 MED ORDER — MIDAZOLAM HCL 2 MG/2ML IJ SOLN
INTRAMUSCULAR | Status: AC
  Filled 2016-10-16: qty 2

## 2016-10-16 MED ORDER — ONDANSETRON HCL 4 MG/2ML IJ SOLN
INTRAMUSCULAR | Status: AC
  Filled 2016-10-16: qty 2

## 2016-10-16 MED ORDER — STERILE WATER FOR IRRIGATION IR SOLN
Status: DC | PRN
  Administered 2016-10-16: 3000 mL

## 2016-10-16 MED ORDER — DEXAMETHASONE SODIUM PHOSPHATE 10 MG/ML IJ SOLN
INTRAMUSCULAR | Status: AC
  Filled 2016-10-16: qty 1

## 2016-10-16 MED ORDER — PROPOFOL 10 MG/ML IV BOLUS
INTRAVENOUS | Status: AC
  Filled 2016-10-16: qty 20

## 2016-10-16 MED ORDER — ACETAMINOPHEN 650 MG RE SUPP
650.0000 mg | RECTAL | Status: DC | PRN
  Filled 2016-10-16: qty 1

## 2016-10-16 MED ORDER — ACETAMINOPHEN 325 MG PO TABS: 650.0000 mg | ORAL_TABLET | ORAL | Status: DC | PRN

## 2016-10-16 MED ORDER — LACTATED RINGERS IV SOLN
INTRAVENOUS | Status: DC
  Administered 2016-10-16 (×2): via INTRAVENOUS

## 2016-10-16 MED ORDER — MORPHINE SULFATE (PF) 4 MG/ML IV SOLN: 2.0000 mg | INTRAVENOUS | Status: DC | PRN

## 2016-10-16 MED ORDER — OXYCODONE HCL 5 MG PO TABS: 5.0000 mg | ORAL_TABLET | Freq: Once | ORAL | Status: DC | PRN

## 2016-10-16 MED ORDER — LIDOCAINE HCL 2 % EX GEL
CUTANEOUS | Status: AC
  Filled 2016-10-16: qty 5

## 2016-10-16 MED ORDER — OXYCODONE HCL 5 MG PO TABS: 5.0000 mg | ORAL_TABLET | ORAL | Status: DC | PRN

## 2016-10-16 MED ORDER — PROMETHAZINE HCL 25 MG/ML IJ SOLN: 6.2500 mg | INTRAMUSCULAR | Status: DC | PRN

## 2016-10-16 MED ORDER — FENTANYL CITRATE (PF) 100 MCG/2ML IJ SOLN
INTRAMUSCULAR | Status: DC | PRN
  Administered 2016-10-16: 100 ug via INTRAVENOUS

## 2016-10-16 MED ORDER — SODIUM CHLORIDE 0.9% FLUSH: 3.0000 mL | Freq: Two times a day (BID) | INTRAVENOUS | Status: DC

## 2016-10-16 MED ORDER — MIDAZOLAM HCL 5 MG/5ML IJ SOLN
INTRAMUSCULAR | Status: DC | PRN
  Administered 2016-10-16: 2 mg via INTRAVENOUS

## 2016-10-16 MED ORDER — PROPOFOL 10 MG/ML IV BOLUS
INTRAVENOUS | Status: DC | PRN
  Administered 2016-10-16: 200 mg via INTRAVENOUS

## 2016-10-16 MED ORDER — DEXAMETHASONE SODIUM PHOSPHATE 10 MG/ML IJ SOLN
INTRAMUSCULAR | Status: DC | PRN
  Administered 2016-10-16: 4 mg via INTRAVENOUS

## 2016-10-16 MED ORDER — SODIUM CHLORIDE 0.9 % IV SOLN: 250.0000 mL | INTRAVENOUS | Status: DC | PRN

## 2016-10-16 MED ORDER — SODIUM CHLORIDE 0.9% FLUSH: 3.0000 mL | INTRAVENOUS | Status: DC | PRN

## 2016-10-16 MED ORDER — LIDOCAINE 2% (20 MG/ML) 5 ML SYRINGE
INTRAMUSCULAR | Status: AC
  Filled 2016-10-16: qty 5

## 2016-10-16 MED ORDER — LIDOCAINE HCL (CARDIAC) 20 MG/ML IV SOLN
INTRAVENOUS | Status: DC | PRN
  Administered 2016-10-16: 100 mg via INTRAVENOUS

## 2016-10-16 MED ORDER — ONDANSETRON HCL 4 MG/2ML IJ SOLN
INTRAMUSCULAR | Status: DC | PRN
  Administered 2016-10-16: 4 mg via INTRAVENOUS

## 2016-10-16 MED ORDER — FENTANYL CITRATE (PF) 100 MCG/2ML IJ SOLN
INTRAMUSCULAR | Status: AC
  Filled 2016-10-16: qty 2

## 2016-10-16 MED ORDER — HYDROMORPHONE HCL-NACL 0.5-0.9 MG/ML-% IV SOSY: 0.2500 mg | PREFILLED_SYRINGE | INTRAVENOUS | Status: DC | PRN

## 2016-10-16 MED ORDER — OXYCODONE HCL 5 MG/5ML PO SOLN
5.0000 mg | Freq: Once | ORAL | Status: DC | PRN
  Filled 2016-10-16: qty 5

## 2016-10-16 SURGICAL SUPPLY — 11 items
BAG URO CATCHER STRL LF (MISCELLANEOUS) ×3 IMPLANT
CATH URET 5FR 28IN OPEN ENDED (CATHETERS) IMPLANT
CLOTH BEACON ORANGE TIMEOUT ST (SAFETY) ×3 IMPLANT
COVER SURGICAL LIGHT HANDLE (MISCELLANEOUS) ×3 IMPLANT
GLOVE SURG SS PI 8.0 STRL IVOR (GLOVE) IMPLANT
GOWN STRL REUS W/TWL XL LVL3 (GOWN DISPOSABLE) ×3 IMPLANT
GUIDEWIRE STR DUAL SENSOR (WIRE) ×3 IMPLANT
MANIFOLD NEPTUNE II (INSTRUMENTS) ×3 IMPLANT
PACK CYSTO (CUSTOM PROCEDURE TRAY) ×3 IMPLANT
TUBING CONNECTING 10 (TUBING) ×2 IMPLANT
TUBING CONNECTING 10' (TUBING) ×1

## 2016-10-16 NOTE — Transfer of Care (Signed)
Immediate Anesthesia Transfer of Care Note  Patient: Tony Leon  Procedure(s) Performed: Procedure(s): CYSTOSCOPY WITH REMOVAL OF URETHRAL STONE (N/A)  Patient Location: PACU  Anesthesia Type:General  Level of Consciousness:  sedated, patient cooperative and responds to stimulation  Airway & Oxygen Therapy:Patient Spontanous Breathing and Patient connected to face mask oxgen  Post-op Assessment:  Report given to PACU RN and Post -op Vital signs reviewed and stable  Post vital signs:  Reviewed and stable  Last Vitals:  Vitals:   10/16/16 0812  BP: (!) 143/91  Pulse: 94  Resp: 16  Temp: 77.4 C    Complications: No apparent anesthesia complications

## 2016-10-16 NOTE — Interval H&P Note (Signed)
History and Physical Interval Note:  10/16/2016 9:50 AM  Tony Leon  has presented today for surgery, with the diagnosis of URETERAL STONE AND STAPLE  The various methods of treatment have been discussed with the patient and family. After consideration of risks, benefits and other options for treatment, the patient has consented to  Procedure(s): CYSTOSCOPY WITH REMOVAL OF URETHRAL STONE (N/A) as a surgical intervention .  The patient's history has been reviewed, patient examined, no change in status, stable for surgery.  I have reviewed the patient's chart and labs.  Questions were answered to the patient's satisfaction.     Cali Cuartas J

## 2016-10-16 NOTE — Brief Op Note (Signed)
10/16/2016  10:18 AM  PATIENT:  Christen Bame  51 y.o. male  PRE-OPERATIVE DIAGNOSIS:  URETHRAL STONE AND STAPLE  POST-OPERATIVE DIAGNOSIS: URETHRAL  STONE AND STAPLE  PROCEDURE:  Procedure(s): CYSTOSCOPY WITH REMOVAL OF URETHRAL STONE (N/A)  SURGEON:  Surgeon(s) and Role:    * Irine Seal, MD - Primary  PHYSICIAN ASSISTANT:   ASSISTANTS: none   ANESTHESIA:   general  EBL:  Total I/O In: 600 [I.V.:600] Out: -   BLOOD ADMINISTERED:none  DRAINS: none   LOCAL MEDICATIONS USED:  NONE  SPECIMEN:  Source of Specimen:  stone and staple  DISPOSITION OF SPECIMEN:  to patient  COUNTS:  YES  TOURNIQUET:  * No tourniquets in log *  DICTATION: .Other Dictation: Dictation Number [id number]  PLAN OF CARE: Discharge to home after PACU  PATIENT DISPOSITION:  PACU - hemodynamically stable.   Delay start of Pharmacological VTE agent (>24hrs) due to surgical blood loss or risk of bleeding: not applicable

## 2016-10-16 NOTE — Anesthesia Preprocedure Evaluation (Addendum)
Anesthesia Evaluation  Patient identified by MRN, date of birth, ID band Patient awake    Reviewed: Allergy & Precautions, H&P , NPO status , Patient's Chart, lab work & pertinent test results  Airway Mallampati: II  TM Distance: >3 FB Neck ROM: Full    Dental no notable dental hx.    Pulmonary neg pulmonary ROS, Current Smoker,    Pulmonary exam normal breath sounds clear to auscultation       Cardiovascular Exercise Tolerance: Good hypertension, Pt. on medications negative cardio ROS Normal cardiovascular exam Rhythm:Regular Rate:Normal     Neuro/Psych  Headaches, negative psych ROS   GI/Hepatic Neg liver ROS, GERD  Medicated,  Endo/Other  diabetes, Type 2  Renal/GU negative Renal ROS  negative genitourinary   Musculoskeletal negative musculoskeletal ROS (+)   Abdominal (+) + obese,   Peds negative pediatric ROS (+)  Hematology negative hematology ROS (+)   Anesthesia Other Findings   Reproductive/Obstetrics negative OB ROS                             Anesthesia Physical  Anesthesia Plan  ASA: III  Anesthesia Plan: General   Post-op Pain Management:    Induction: Intravenous  PONV Risk Score and Plan: 3 and Ondansetron, Propofol, Midazolam and Treatment may vary due to age or medical condition  Airway Management Planned: LMA  Additional Equipment:   Intra-op Plan:   Post-operative Plan: Extubation in OR  Informed Consent: I have reviewed the patients History and Physical, chart, labs and discussed the procedure including the risks, benefits and alternatives for the proposed anesthesia with the patient or authorized representative who has indicated his/her understanding and acceptance.   Dental advisory given  Plan Discussed with: CRNA  Anesthesia Plan Comments:        Anesthesia Quick Evaluation

## 2016-10-16 NOTE — Interval H&P Note (Signed)
History and Physical Interval Note:  10/16/2016 9:51 AM  Tony Leon  has presented today for surgery, with the diagnosis of URETERAL STONE AND STAPLE  The various methods of treatment have been discussed with the patient and family. After consideration of risks, benefits and other options for treatment, the patient has consented to  Procedure(s): CYSTOSCOPY WITH REMOVAL OF URETHRAL STONE (N/A) as a surgical intervention .  The patient's history has been reviewed, patient examined, no change in status, stable for surgery.  I have reviewed the patient's chart and labs.  Questions were answered to the patient's satisfaction.     [name] J

## 2016-10-16 NOTE — Discharge Instructions (Addendum)
General Anesthesia, Adult, Care After °These instructions provide you with information about caring for yourself after your procedure. Your health care provider may also give you more specific instructions. Your treatment has been planned according to current medical practices, but problems sometimes occur. Call your health care provider if you have any problems or questions after your procedure. °What can I expect after the procedure? °After the procedure, it is common to have: °· Vomiting. °· A sore throat. °· Mental slowness. ° °It is common to feel: °· Nauseous. °· Cold or shivery. °· Sleepy. °· Tired. °· Sore or achy, even in parts of your body where you did not have surgery. ° °Follow these instructions at home: °For at least 24 hours after the procedure: °· Do not: °? Participate in activities where you could fall or become injured. °? Drive. °? Use heavy machinery. °? Drink alcohol. °? Take sleeping pills or medicines that cause drowsiness. °? Make important decisions or sign legal documents. °? Take care of children on your own. °· Rest. °Eating and drinking °· If you vomit, drink water, juice, or soup when you can drink without vomiting. °· Drink enough fluid to keep your urine clear or pale yellow. °· Make sure you have little or no nausea before eating solid foods. °· Follow the diet recommended by your health care provider. °General instructions °· Have a responsible adult stay with you until you are awake and alert. °· Return to your normal activities as told by your health care provider. Ask your health care provider what activities are safe for you. °· Take over-the-counter and prescription medicines only as told by your health care provider. °· If you smoke, do not smoke without supervision. °· Keep all follow-up visits as told by your health care provider. This is important. °Contact a health care provider if: °· You continue to have nausea or vomiting at home, and medicines are not helpful. °· You  cannot drink fluids or start eating again. °· You cannot urinate after 8-12 hours. °· You develop a skin rash. °· You have fever. °· You have increasing redness at the site of your procedure. °Get help right away if: °· You have difficulty breathing. °· You have chest pain. °· You have unexpected bleeding. °· You feel that you are having a life-threatening or urgent problem. °This information is not intended to replace advice given to you by your health care provider. Make sure you discuss any questions you have with your health care provider. °Document Released: 06/30/2000 Document Revised: 08/27/2015 Document Reviewed: 03/08/2015 °Elsevier Interactive Patient Education © 2018 [employer] ° ° °CYSTOSCOPY HOME CARE INSTRUCTIONS ° °Activity: °Rest for the remainder of the day.  Do not drive or operate equipment today.  You may resume normal activities in one to two days as instructed by your physician.  ° °Meals: °Drink plenty of liquids and eat light foods such as gelatin or soup this evening.  You may return to a normal meal plan tomorrow. ° °Return to Work: °You may return to work in one to two days or as instructed by your physician. ° °Special Instructions / Symptoms: °Call your physician if any of these symptoms occur: ° ° -persistent or heavy bleeding ° -bleeding which continues after first few urination ° -large blood clots that are difficult to pass ° -urine stream diminishes or stops completely ° -fever equal to or higher than 101 degrees Farenheit. ° -cloudy urine with a strong, foul odor ° -severe pain ° °Females   should always wipe from front to back after elimination.  You may feel some burning pain when you urinate.  This should disappear with time.  Applying moist heat to the lower abdomen or a hot tub bath may help relieve the pain. \ ° ° ° °Patient Signature:  ________________________________________________________ ° °Nurse's Signature:  ________________________________________________________ ° °

## 2016-10-16 NOTE — Op Note (Signed)
Tony Leon, Tony Leon                 ACCOUNT NO.:  [id number]  MEDICAL RECORD NO.:  [id number]  LOCATION:                                 FACILITY:  PHYSICIAN:  Marshall Cork. Jeffie Pollock, M.D.         DATE OF BIRTH:  DATE OF PROCEDURE:  10/16/2016 DATE OF DISCHARGE:                              OPERATIVE REPORT   PROCEDURE:  Cystoscopy with removal of urethral stone and staple.  PREOPERATIVE DIAGNOSIS:  Hematuria with urethral stone and exposed staple.  POSTOPERATIVE DIAGNOSIS:  Hematuria with urethral stone and exposed staple.  SURGEON:  Marshall Cork. Jeffie Pollock, M.D.  ANESTHESIA:  General.  SPECIMEN:  Stone and staple.  DRAINS:  None.  ESTIMATED BLOOD LOSS:  None.  COMPLICATIONS:  None.  INDICATIONS:  Tony Leon is a 51 year old white male with a history of prostate cancer treated with radical prostatectomy and subsequent salvage radiation therapy.  He has had recent hematuria and on office cystoscopy he was found to have one of the dorsal vein staples had eroded into the anastomotic area and had adherent calcification.  It was felt this needed to be removed.  DESCRIPTION OF PROCEDURE:  He was taken to the operating room where general anesthetic was induced.  He was placed in lithotomy position. He was given 3 g of Ancef and was fitted with PAS hose.  His perineum and genitalia were prepped with Betadine solution.  He was draped in usual sterile fashion.  Cystoscopy was performed using the 23-French scope and the 30-degree lens.  Examination revealed a normal urethra.  The external sphincter was intact.  The anastomosis was widely patent without contracture. There was a clip at 12 o'clock with an adherent 3 mm stone.  The bladder had mild trabeculation.  No tumors, stones, or inflammation were noted.  The ureteral orifices were unremarkable.  A rigid grasping forceps was then used to grasp the stone and clip and removed them.  Afterwards, there was no bleeding.  The clip and stone were  removed from the bladder.  The bladder was partially drained.  The scope was removed.  The patient was taken down from lithotomy position. His anesthetic was reversed.  He was moved to recovery room in stable condition.  There were no complications.     Marshall Cork. Jeffie Pollock, M.D.     JJW/MEDQ  D:  10/16/2016  T:  10/16/2016  Job:  [id number]

## 2016-10-16 NOTE — Anesthesia Procedure Notes (Signed)
Procedure Name: LMA Insertion Date/Time: 10/16/2016 10:09 AM Performed by: Tydus Sanmiguel Pre-anesthesia Checklist: Patient identified, Emergency Drugs available, Suction available and Patient being monitored Patient Re-evaluated:Patient Re-evaluated prior to induction Oxygen Delivery Method: Circle System Utilized Preoxygenation: Pre-oxygenation with 100% oxygen Induction Type: IV induction Ventilation: Mask ventilation without difficulty LMA: LMA inserted LMA Size: 4.0 Number of attempts: 1 Airway Equipment and Method: Bite block Placement Confirmation: positive ETCO2 Tube secured with: Tape Dental Injury: Teeth and Oropharynx as per pre-operative assessment

## 2016-10-16 NOTE — Anesthesia Postprocedure Evaluation (Signed)
Anesthesia Post Note  Patient: Tony Leon  Procedure(s) Performed: Procedure(s) (LRB): CYSTOSCOPY WITH REMOVAL OF URETHRAL STONE (N/A)     Patient location during evaluation: PACU Anesthesia Type: General Level of consciousness: awake and alert Pain management: pain level controlled Vital Signs Assessment: post-procedure vital signs reviewed and stable Respiratory status: spontaneous breathing, nonlabored ventilation and respiratory function stable Cardiovascular status: blood pressure returned to baseline and stable Postop Assessment: no signs of nausea or vomiting Anesthetic complications: no    Last Vitals:  Vitals:   10/16/16 1105 10/16/16 1125  BP: 123/76 127/88  Pulse: 77 77  Resp: 20 18  Temp: 36.7 C 36.4 C    Last Pain:  Vitals:   10/16/16 1125  TempSrc: Oral  PainSc: 1                  Lynda Rainwater

## 2018-11-17 IMAGING — DX DG SINUSES COMPLETE 3+V
3 series · 3 of 3 positions shown · non-contrast
Comparison: None

CLINICAL DATA: Chronic sinusitis

EXAM:
PARANASAL SINUSES - COMPLETE 3 + VIEW

[dg sinuses complete (1 of 3)]
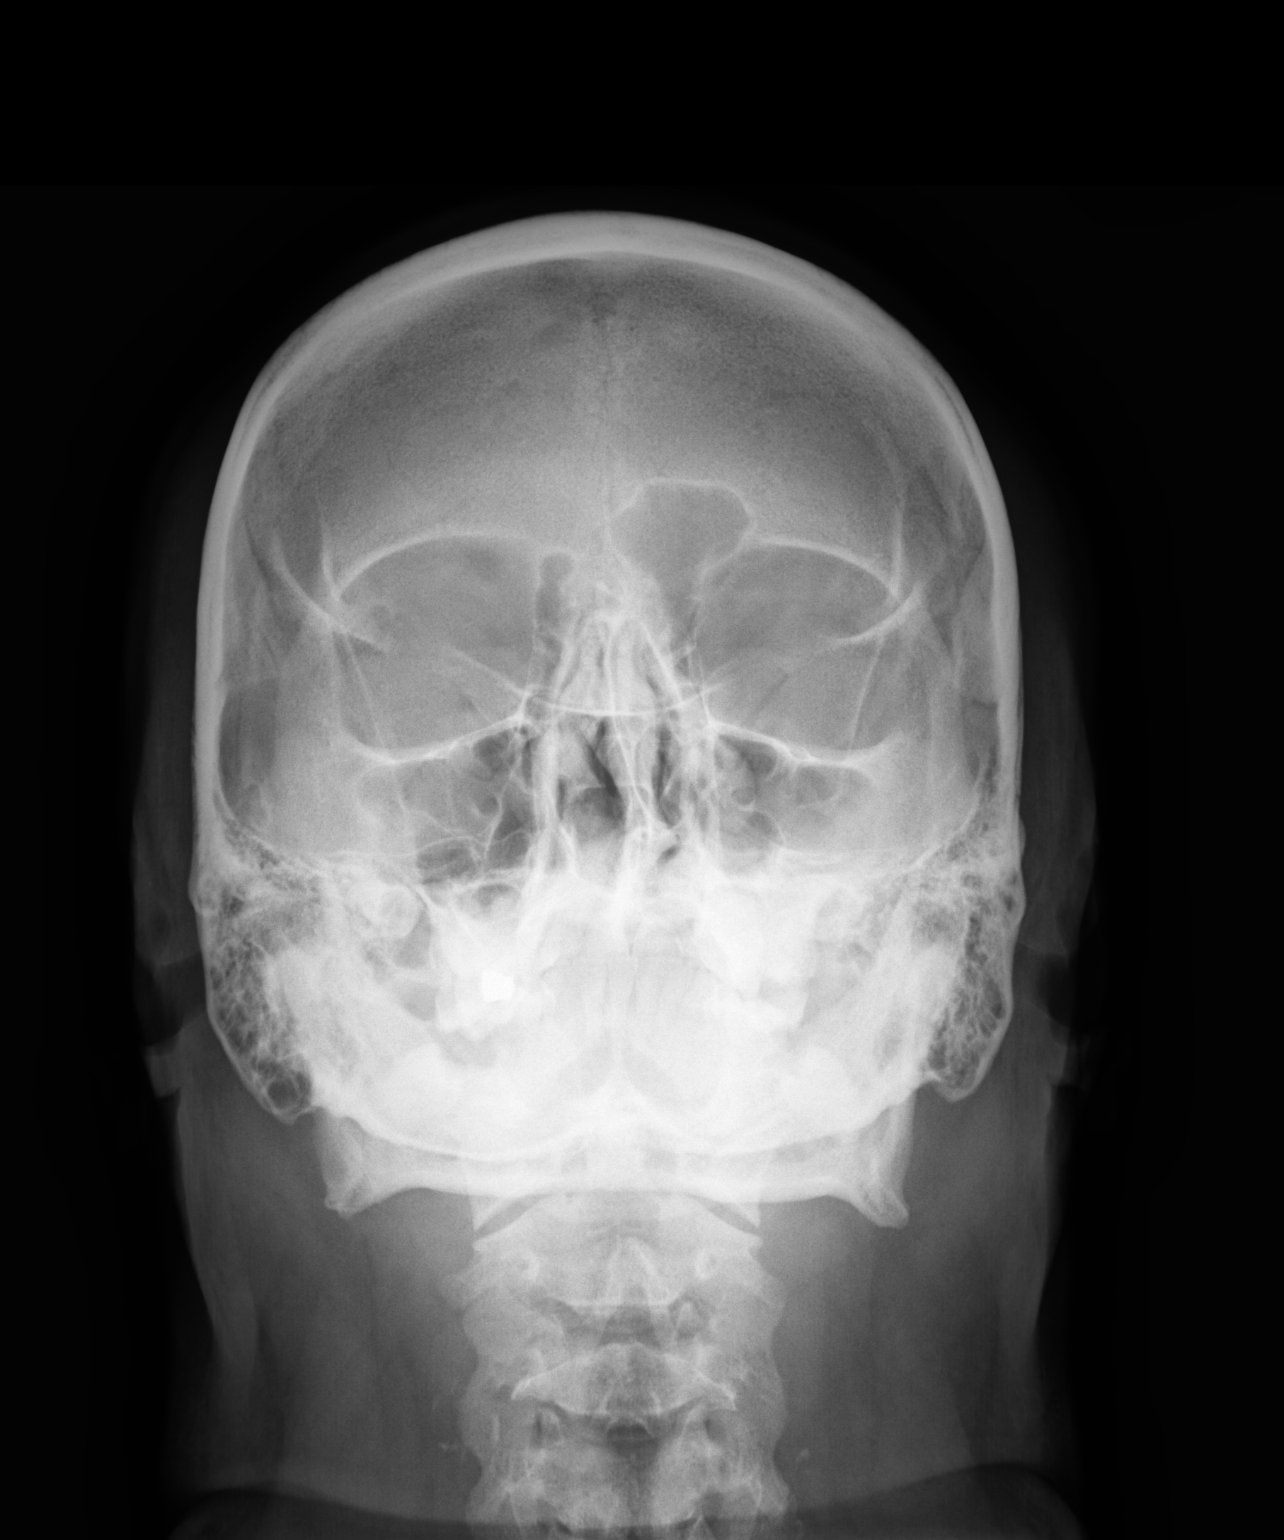

[dg sinuses complete (2 of 3)]
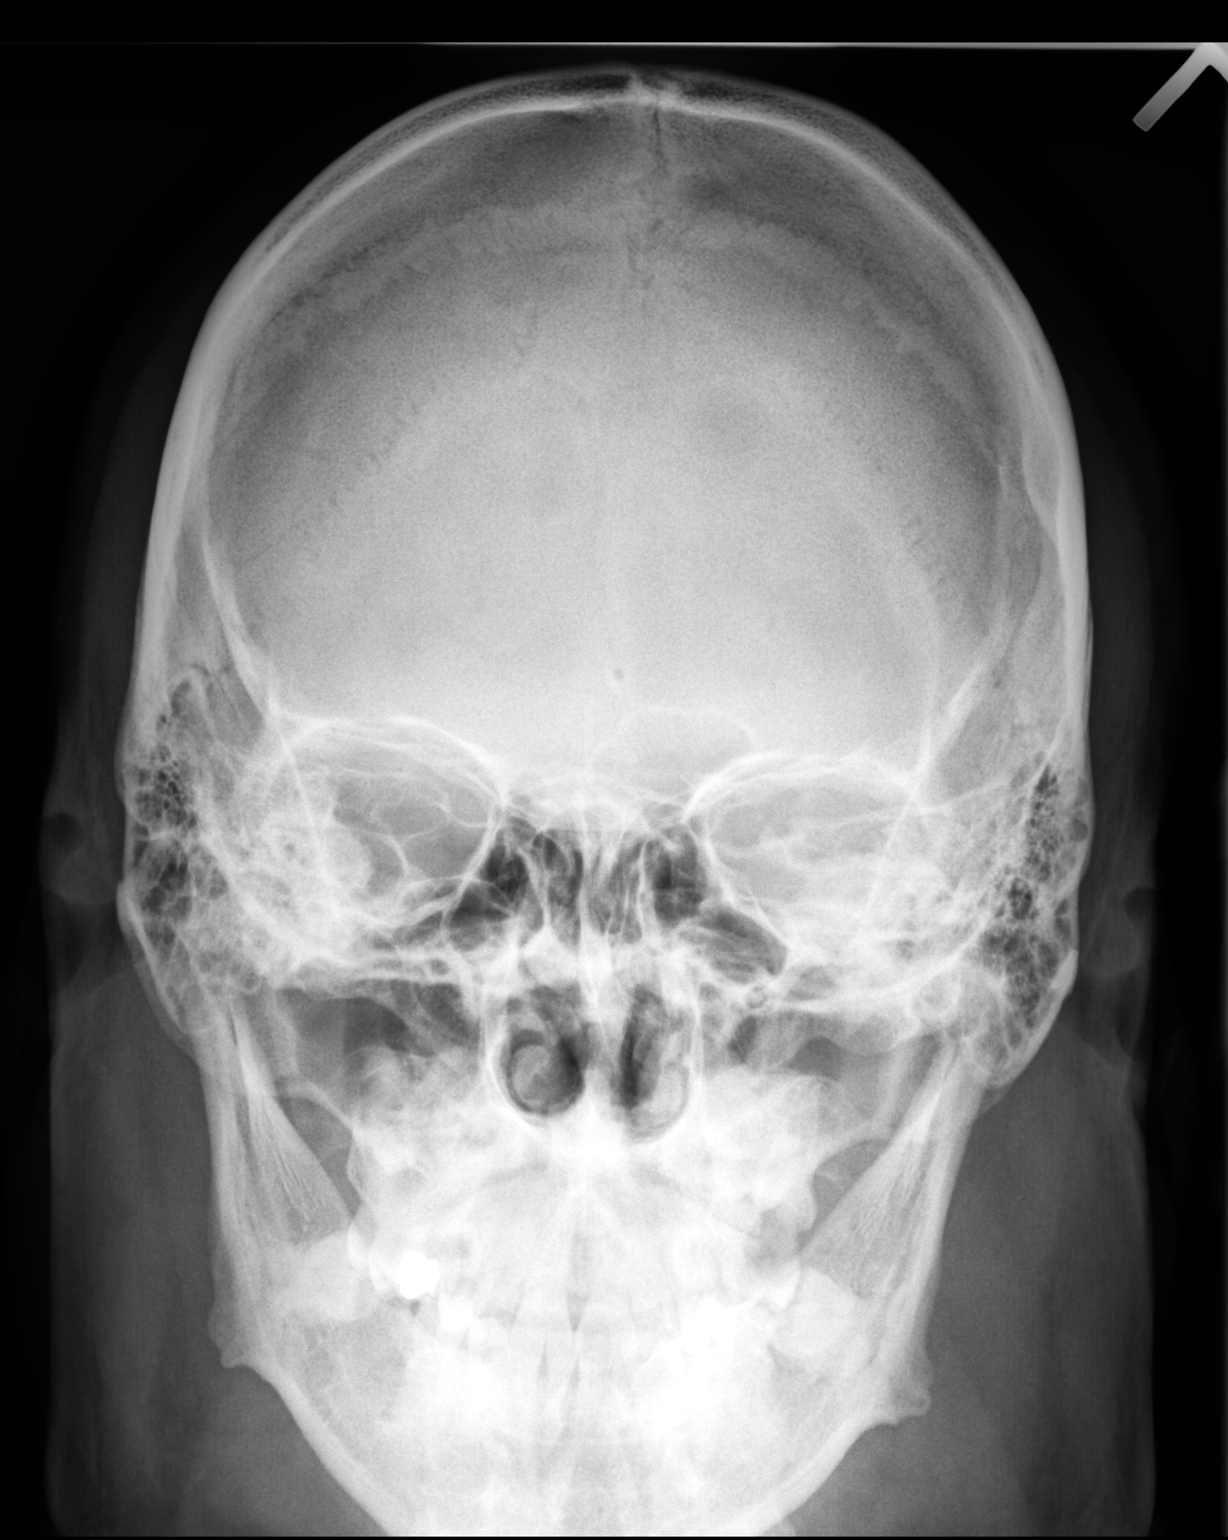

[dg sinuses complete (3 of 3)]
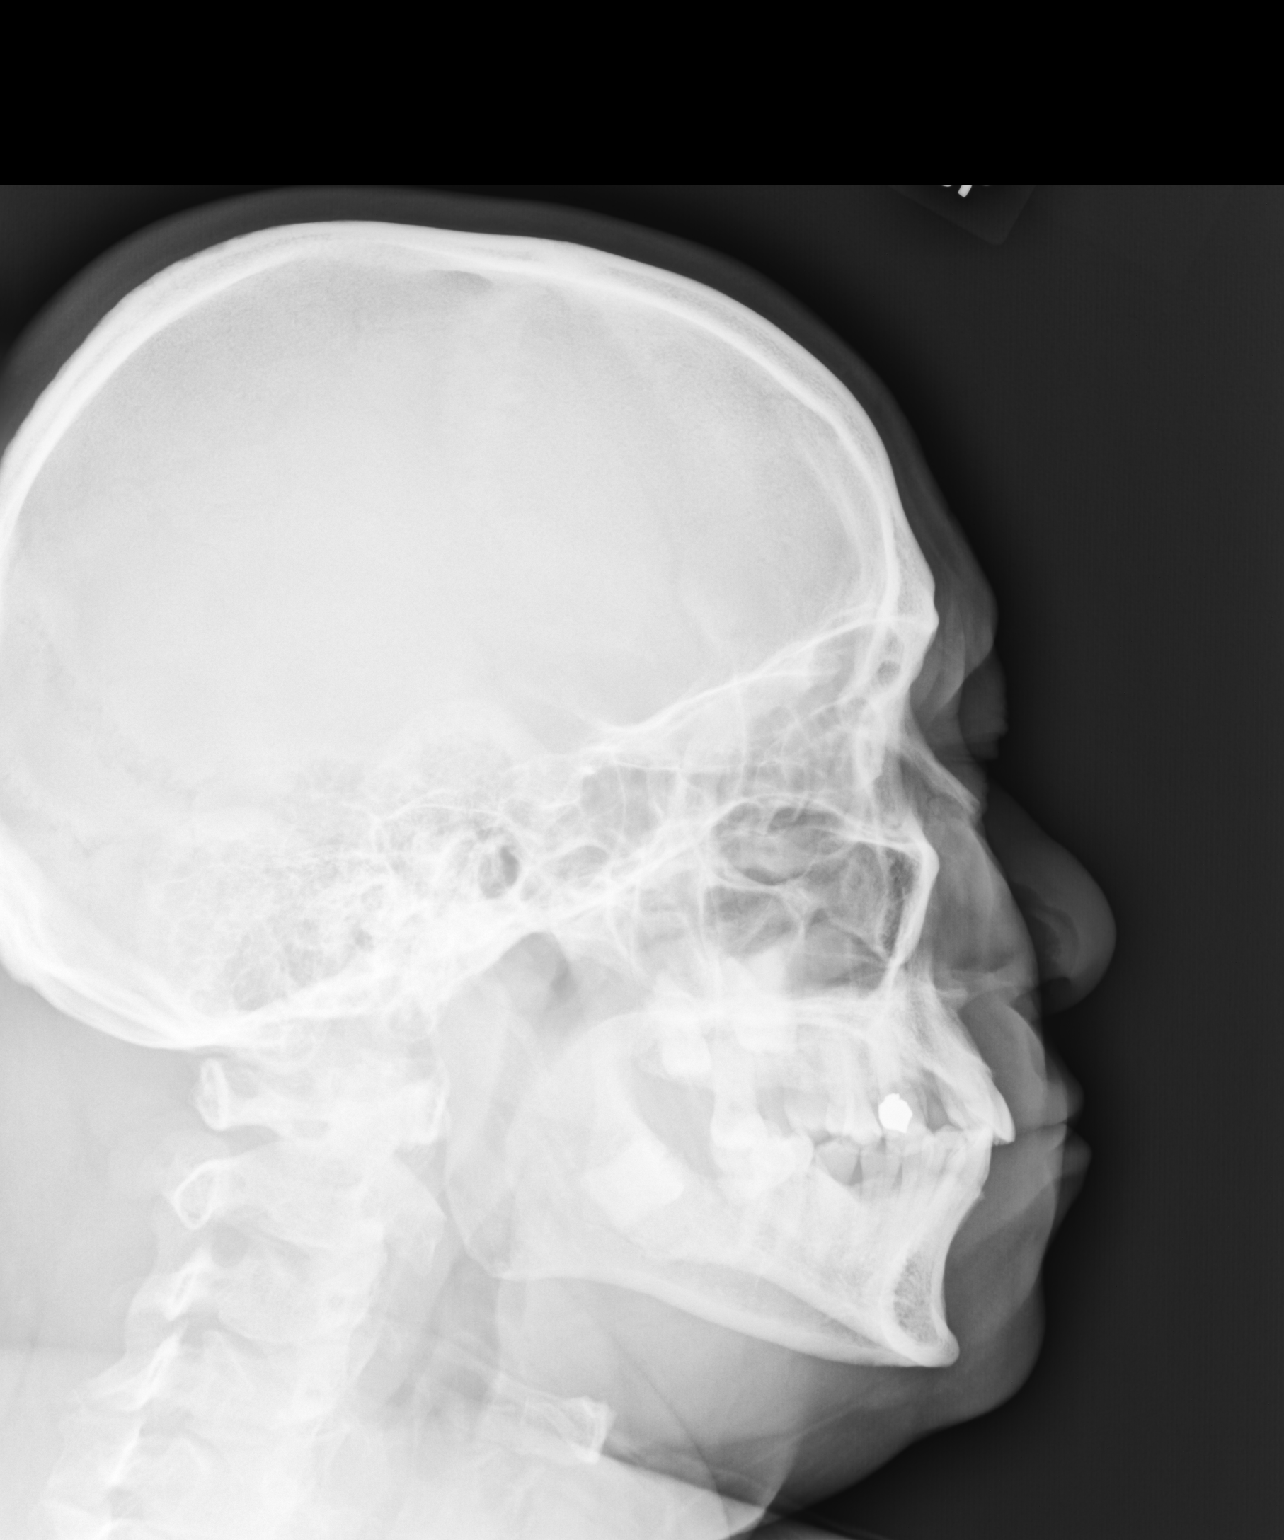

[3 of 3 positions shown; findings below may reference images not displayed]

FINDINGS: Paranasal sinuses clear.

Visualized mastoid air cells clear.

Nasal septal deviation to the LEFT.

No acute osseous findings otherwise identified.
IMPRESSION: Nasal septal deviation to the LEFT.

Clear paranasal sinuses and visualized mastoid air cells.

## 2020-12-14 DIAGNOSIS — E538 Deficiency of other specified B group vitamins: Secondary | ICD-10-CM | POA: Diagnosis not present

## 2020-12-14 DIAGNOSIS — S91339A Puncture wound without foreign body, unspecified foot, initial encounter: Secondary | ICD-10-CM | POA: Diagnosis not present

## 2020-12-14 DIAGNOSIS — Z23 Encounter for immunization: Secondary | ICD-10-CM | POA: Diagnosis not present

## 2020-12-14 DIAGNOSIS — Z6834 Body mass index (BMI) 34.0-34.9, adult: Secondary | ICD-10-CM | POA: Diagnosis not present

## 2020-12-14 DIAGNOSIS — R030 Elevated blood-pressure reading, without diagnosis of hypertension: Secondary | ICD-10-CM | POA: Diagnosis not present

## 2021-01-16 DIAGNOSIS — E119 Type 2 diabetes mellitus without complications: Secondary | ICD-10-CM | POA: Diagnosis not present

## 2021-01-16 DIAGNOSIS — I10 Essential (primary) hypertension: Secondary | ICD-10-CM | POA: Diagnosis not present

## 2021-01-16 DIAGNOSIS — K219 Gastro-esophageal reflux disease without esophagitis: Secondary | ICD-10-CM | POA: Diagnosis not present

## 2021-07-04 DIAGNOSIS — K029 Dental caries, unspecified: Secondary | ICD-10-CM | POA: Diagnosis not present

## 2021-07-04 DIAGNOSIS — E039 Hypothyroidism, unspecified: Secondary | ICD-10-CM | POA: Diagnosis not present

## 2021-07-04 DIAGNOSIS — I10 Essential (primary) hypertension: Secondary | ICD-10-CM | POA: Diagnosis not present

## 2021-07-04 DIAGNOSIS — E7800 Pure hypercholesterolemia, unspecified: Secondary | ICD-10-CM | POA: Diagnosis not present

## 2021-07-04 DIAGNOSIS — E119 Type 2 diabetes mellitus without complications: Secondary | ICD-10-CM | POA: Diagnosis not present

## 2021-07-04 DIAGNOSIS — N419 Inflammatory disease of prostate, unspecified: Secondary | ICD-10-CM | POA: Diagnosis not present

## 2021-07-19 DIAGNOSIS — J3489 Other specified disorders of nose and nasal sinuses: Secondary | ICD-10-CM | POA: Diagnosis not present

## 2021-07-19 DIAGNOSIS — R52 Pain, unspecified: Secondary | ICD-10-CM | POA: Diagnosis not present

## 2021-08-09 DIAGNOSIS — R059 Cough, unspecified: Secondary | ICD-10-CM | POA: Diagnosis not present

## 2021-08-09 DIAGNOSIS — J029 Acute pharyngitis, unspecified: Secondary | ICD-10-CM | POA: Diagnosis not present

## 2021-09-30 DIAGNOSIS — R52 Pain, unspecified: Secondary | ICD-10-CM | POA: Diagnosis not present

## 2021-09-30 DIAGNOSIS — T300 Burn of unspecified body region, unspecified degree: Secondary | ICD-10-CM | POA: Diagnosis not present

## 2021-10-17 DIAGNOSIS — E1100 Type 2 diabetes mellitus with hyperosmolarity without nonketotic hyperglycemic-hyperosmolar coma (NKHHC): Secondary | ICD-10-CM | POA: Diagnosis not present

## 2021-10-17 DIAGNOSIS — I10 Essential (primary) hypertension: Secondary | ICD-10-CM | POA: Diagnosis not present

## 2022-05-07 DIAGNOSIS — Z7251 High risk heterosexual behavior: Secondary | ICD-10-CM | POA: Diagnosis not present

## 2022-05-07 DIAGNOSIS — I10 Essential (primary) hypertension: Secondary | ICD-10-CM | POA: Diagnosis not present

## 2022-05-07 DIAGNOSIS — E785 Hyperlipidemia, unspecified: Secondary | ICD-10-CM | POA: Diagnosis not present

## 2022-05-07 DIAGNOSIS — G63 Polyneuropathy in diseases classified elsewhere: Secondary | ICD-10-CM | POA: Diagnosis not present

## 2022-05-07 DIAGNOSIS — E559 Vitamin D deficiency, unspecified: Secondary | ICD-10-CM | POA: Diagnosis not present

## 2022-05-07 DIAGNOSIS — E119 Type 2 diabetes mellitus without complications: Secondary | ICD-10-CM | POA: Diagnosis not present

## 2022-05-07 DIAGNOSIS — R079 Chest pain, unspecified: Secondary | ICD-10-CM | POA: Diagnosis not present

## 2022-05-08 ENCOUNTER — Emergency Department (HOSPITAL_COMMUNITY): Payer: BC Managed Care – PPO

## 2022-05-08 ENCOUNTER — Observation Stay (HOSPITAL_COMMUNITY)
Admission: EM | Admit: 2022-05-08 | Discharge: 2022-05-10 | Disposition: A | Discharge status: Home / Self Care | Payer: BC Managed Care – PPO | Attending: Internal Medicine | Admitting: Internal Medicine

## 2022-05-08 ENCOUNTER — Encounter (HOSPITAL_COMMUNITY): Payer: Self-pay

## 2022-05-08 ENCOUNTER — Other Ambulatory Visit: Payer: Self-pay

## 2022-05-08 DIAGNOSIS — I209 Angina pectoris, unspecified: Secondary | ICD-10-CM | POA: Diagnosis not present

## 2022-05-08 DIAGNOSIS — I200 Unstable angina: Secondary | ICD-10-CM

## 2022-05-08 DIAGNOSIS — N182 Chronic kidney disease, stage 2 (mild): Secondary | ICD-10-CM | POA: Diagnosis not present

## 2022-05-08 DIAGNOSIS — E1122 Type 2 diabetes mellitus with diabetic chronic kidney disease: Secondary | ICD-10-CM | POA: Diagnosis not present

## 2022-05-08 DIAGNOSIS — Z8546 Personal history of malignant neoplasm of prostate: Secondary | ICD-10-CM | POA: Diagnosis not present

## 2022-05-08 DIAGNOSIS — Z79899 Other long term (current) drug therapy: Secondary | ICD-10-CM | POA: Diagnosis not present

## 2022-05-08 DIAGNOSIS — F17210 Nicotine dependence, cigarettes, uncomplicated: Secondary | ICD-10-CM | POA: Diagnosis not present

## 2022-05-08 DIAGNOSIS — I2510 Atherosclerotic heart disease of native coronary artery without angina pectoris: Secondary | ICD-10-CM

## 2022-05-08 DIAGNOSIS — I25118 Atherosclerotic heart disease of native coronary artery with other forms of angina pectoris: Secondary | ICD-10-CM | POA: Diagnosis not present

## 2022-05-08 DIAGNOSIS — Z0389 Encounter for observation for other suspected diseases and conditions ruled out: Secondary | ICD-10-CM | POA: Diagnosis not present

## 2022-05-08 DIAGNOSIS — Z720 Tobacco use: Secondary | ICD-10-CM

## 2022-05-08 DIAGNOSIS — E785 Hyperlipidemia, unspecified: Secondary | ICD-10-CM | POA: Insufficient documentation

## 2022-05-08 DIAGNOSIS — E119 Type 2 diabetes mellitus without complications: Secondary | ICD-10-CM

## 2022-05-08 DIAGNOSIS — J449 Chronic obstructive pulmonary disease, unspecified: Secondary | ICD-10-CM | POA: Diagnosis not present

## 2022-05-08 DIAGNOSIS — Z7982 Long term (current) use of aspirin: Secondary | ICD-10-CM | POA: Insufficient documentation

## 2022-05-08 DIAGNOSIS — K219 Gastro-esophageal reflux disease without esophagitis: Secondary | ICD-10-CM | POA: Insufficient documentation

## 2022-05-08 DIAGNOSIS — E1140 Type 2 diabetes mellitus with diabetic neuropathy, unspecified: Secondary | ICD-10-CM | POA: Insufficient documentation

## 2022-05-08 DIAGNOSIS — Z7984 Long term (current) use of oral hypoglycemic drugs: Secondary | ICD-10-CM | POA: Diagnosis not present

## 2022-05-08 DIAGNOSIS — F1210 Cannabis abuse, uncomplicated: Secondary | ICD-10-CM

## 2022-05-08 DIAGNOSIS — R079 Chest pain, unspecified: Secondary | ICD-10-CM | POA: Diagnosis not present

## 2022-05-08 DIAGNOSIS — E1165 Type 2 diabetes mellitus with hyperglycemia: Secondary | ICD-10-CM

## 2022-05-08 DIAGNOSIS — I25110 Atherosclerotic heart disease of native coronary artery with unstable angina pectoris: Secondary | ICD-10-CM | POA: Diagnosis not present

## 2022-05-08 DIAGNOSIS — R7989 Other specified abnormal findings of blood chemistry: Secondary | ICD-10-CM | POA: Diagnosis not present

## 2022-05-08 DIAGNOSIS — I10 Essential (primary) hypertension: Secondary | ICD-10-CM

## 2022-05-08 DIAGNOSIS — I129 Hypertensive chronic kidney disease with stage 1 through stage 4 chronic kidney disease, or unspecified chronic kidney disease: Secondary | ICD-10-CM | POA: Insufficient documentation

## 2022-05-08 HISTORY — DX: Tobacco use: Z72.0

## 2022-05-08 HISTORY — DX: Essential (primary) hypertension: I10

## 2022-05-08 HISTORY — DX: Hyperlipidemia, unspecified: E78.5

## 2022-05-08 HISTORY — DX: Type 2 diabetes mellitus without complications: E11.9

## 2022-05-08 HISTORY — DX: Cannabis abuse, uncomplicated: F12.10

## 2022-05-08 HISTORY — DX: Type 2 diabetes mellitus with diabetic neuropathy, unspecified: E11.40

## 2022-05-08 LAB — LIPID PANEL
Cholesterol: 305 mg/dL — ABNORMAL HIGH (ref 0–200)
HDL: 43 mg/dL (ref >40)
LDL Cholesterol: UNDETERMINED mg/dL (ref 0–99)
Total CHOL/HDL Ratio: 7.1 RATIO
Triglycerides: 903 mg/dL — ABNORMAL HIGH (ref <150)
VLDL: UNDETERMINED mg/dL (ref 0–40)

## 2022-05-08 LAB — HEMOGLOBIN A1C
Hgb A1c MFr Bld: 14.2 % — ABNORMAL HIGH (ref 4.8–5.6)
Mean Plasma Glucose: 360.84 mg/dL

## 2022-05-08 LAB — BASIC METABOLIC PANEL
Anion gap: 13 (ref 5–15)
BUN: 22 mg/dL — ABNORMAL HIGH (ref 6–20)
CO2: 24 mmol/L (ref 22–32)
Calcium: 9.2 mg/dL (ref 8.9–10.3)
Chloride: 98 mmol/L (ref 98–111)
Creatinine, Ser: 1.25 mg/dL — ABNORMAL HIGH (ref 0.61–1.24)
GFR, Estimated: >60 mL/min (ref >60)
Glucose, Bld: 300 mg/dL — ABNORMAL HIGH (ref 70–99)
Potassium: 4.5 mmol/L (ref 3.5–5.1)
Sodium: 135 mmol/L (ref 135–145)

## 2022-05-08 LAB — CBC
HCT: 45.6 % (ref 39.0–52.0)
Hemoglobin: 16.1 g/dL (ref 13.0–17.0)
MCH: 33.3 pg (ref 26.0–34.0)
MCHC: 35.3 g/dL (ref 30.0–36.0)
MCV: 94.2 fL (ref 80.0–100.0)
Platelets: 176 10*3/uL (ref 150–400)
RBC: 4.84 MIL/uL (ref 4.22–5.81)
RDW: 12.6 % (ref 11.5–15.5)
WBC: 5.5 10*3/uL (ref 4.0–10.5)
nRBC: 0 % (ref 0.0–0.2)

## 2022-05-08 LAB — GLUCOSE, CAPILLARY
Glucose-Capillary: 196 mg/dL — ABNORMAL HIGH (ref 70–99)
Glucose-Capillary: 330 mg/dL — ABNORMAL HIGH (ref 70–99)

## 2022-05-08 LAB — ETHANOL: Alcohol, Ethyl (B): <10 mg/dL (ref <10)

## 2022-05-08 LAB — RAPID URINE DRUG SCREEN, HOSP PERFORMED
Amphetamines: NOT DETECTED
Barbiturates: NOT DETECTED
Benzodiazepines: NOT DETECTED
Cocaine: NOT DETECTED
Opiates: NOT DETECTED
Tetrahydrocannabinol: NOT DETECTED

## 2022-05-08 LAB — TSH: TSH: 0.613 u[IU]/mL (ref 0.350–4.500)

## 2022-05-08 LAB — HIV ANTIBODY (ROUTINE TESTING W REFLEX): HIV Screen 4th Generation wRfx: NONREACTIVE

## 2022-05-08 LAB — LDL CHOLESTEROL, DIRECT: Direct LDL: 137 mg/dL — ABNORMAL HIGH (ref 0–99)

## 2022-05-08 LAB — TROPONIN I (HIGH SENSITIVITY)
Troponin I (High Sensitivity): 25 ng/L — ABNORMAL HIGH (ref <18)
Troponin I (High Sensitivity): 26 ng/L — ABNORMAL HIGH (ref <18)

## 2022-05-08 MED ORDER — GABAPENTIN 300 MG PO CAPS
300.0000 mg | ORAL_CAPSULE | Freq: Every day | ORAL | Status: DC
  Administered 2022-05-09 – 2022-05-10 (×2): 300 mg via ORAL
  Filled 2022-05-08 (×2): qty 1

## 2022-05-08 MED ORDER — ASPIRIN 325 MG PO TABS: 325.0000 mg | ORAL_TABLET | Freq: Every day | ORAL | Status: DC

## 2022-05-08 MED ORDER — ONDANSETRON HCL 4 MG/2ML IJ SOLN: 4.0000 mg | Freq: Four times a day (QID) | INTRAMUSCULAR | Status: DC | PRN

## 2022-05-08 MED ORDER — LISINOPRIL 10 MG PO TABS: 10.0000 mg | ORAL_TABLET | Freq: Every day | ORAL | Status: DC

## 2022-05-08 MED ORDER — PANTOPRAZOLE SODIUM 40 MG PO TBEC
40.0000 mg | DELAYED_RELEASE_TABLET | Freq: Every day | ORAL | Status: DC
  Administered 2022-05-08 – 2022-05-10 (×3): 40 mg via ORAL
  Filled 2022-05-08 (×3): qty 1

## 2022-05-08 MED ORDER — INSULIN ASPART 100 UNIT/ML IJ SOLN
0.0000 [IU] | Freq: Every day | INTRAMUSCULAR | Status: DC
  Administered 2022-05-08: 4 [IU] via SUBCUTANEOUS
  Administered 2022-05-09: 3 [IU] via SUBCUTANEOUS

## 2022-05-08 MED ORDER — ATORVASTATIN CALCIUM 40 MG PO TABS
40.0000 mg | ORAL_TABLET | Freq: Every day | ORAL | Status: DC
  Administered 2022-05-08 – 2022-05-09 (×2): 40 mg via ORAL
  Filled 2022-05-08 (×2): qty 1

## 2022-05-08 MED ORDER — ACETAMINOPHEN 325 MG PO TABS: 650.0000 mg | ORAL_TABLET | ORAL | Status: DC | PRN

## 2022-05-08 MED ORDER — INSULIN ASPART 100 UNIT/ML IJ SOLN
0.0000 [IU] | Freq: Three times a day (TID) | INTRAMUSCULAR | Status: DC
  Administered 2022-05-08 – 2022-05-09 (×2): 3 [IU] via SUBCUTANEOUS
  Administered 2022-05-09: 8 [IU] via SUBCUTANEOUS
  Administered 2022-05-10 (×2): 5 [IU] via SUBCUTANEOUS

## 2022-05-08 MED ORDER — ENOXAPARIN SODIUM 40 MG/0.4ML IJ SOSY
40.0000 mg | PREFILLED_SYRINGE | INTRAMUSCULAR | Status: DC
  Filled 2022-05-08: qty 0.4

## 2022-05-08 MED ORDER — LORATADINE 10 MG PO TABS
10.0000 mg | ORAL_TABLET | Freq: Every day | ORAL | Status: DC
  Administered 2022-05-09 – 2022-05-10 (×2): 10 mg via ORAL
  Filled 2022-05-08 (×2): qty 1

## 2022-05-08 NOTE — ED Triage Notes (Signed)
Pt reports CP that started this morning. Off and on x1 week.

## 2022-05-08 NOTE — ED Provider Notes (Signed)
[place] EMERGENCY DEPARTMENT AT [facility] Provider Note   CSN: [id number] Arrival date & time: 05/08/22  1130  History Chief Complaint  Patient presents with   Chest Pain   Tony Leon is a 57 y.o. male.  57 yo man here with intermittent chest pain. Reports episodes of bilateral chest tightness extending up to his bilateral jaw with exertion.  He works as a Oceanographer and walks quite a lot.  His episodes have been happening for the last week and a half, initially episodes lasted 15 minutes, now lasting for hours.  PMH includes prostate cancer s/p robotic radial prostectomy in 2014, HTN, uncontrolled diabetes, HLD, GERD, seasonal allergic rhinitis.  Patient reports he saw his new PCP, Treacy at [facility], yesterday.  He received a call today saying his A1c was 15 and his lipids were "dangerously high".  Unfortunately we do not have any of these labs on file.  Patient is copious with factors for CAD including a 60-80-pack-year tobacco history, uncontrolled diabetes, elevated cholesterol, hypertension, and heavy alcohol use.  Current medications: Lisinopril 10 mg Aspirin 325 mg daily Cetirizine  Synjardy XR BID  Pantoprazole 20 mg Gabapentin for neuropathy, unknown dose  Social history: Smoking tobacco since the age of 82 or 57 years old, average 1.5 to 2 packs/day ETOH once or twice a week of varying amounts, drank a pink tequila last night MJ once in a while, none in 3 weeks No other illicit substances   PSH: Laparoscopy With Bilateral Total Pelvic Lymphadenectomy (2014), Prostatect Retropubic Radical W/ Nerve Sparing Laparoscopic (2014), Cystoscopy With Ureteroscopy With Removal Of Calculus (2014)    Home Medications Prior to Admission medications   Medication Sig Start Date End Date Taking? Authorizing Provider  atorvastatin (LIPITOR) 10 MG tablet Take by mouth. 05/08/22  Yes [provider]  gabapentin (NEURONTIN) 300 MG capsule Take 300 mg by  mouth daily. 05/07/22  Yes [provider]  pantoprazole (PROTONIX) 20 MG tablet Take 20 mg by mouth daily. 05/08/22  Yes [provider]  aspirin 325 MG tablet Take 325 mg by mouth daily.    [provider]  Chlorpheniramine Maleate (ALLERGY PO) Take 1 tablet by mouth daily.    [provider]  lisinopril (PRINIVIL,ZESTRIL) 10 MG tablet Take 10 mg by mouth daily. Takes at bedtime    [provider]  metFORMIN (GLUCOPHAGE) 500 MG tablet Take 500 mg by mouth 2 (two) times daily. 09/23/16   [provider]  omeprazole (PRILOSEC) 20 MG capsule Take 20 mg by mouth daily.  11/30/15   [provider]      Allergies    Patient has no known allergies.    Review of Systems   Review of Systems  Constitutional:  Positive for activity change and fatigue. Negative for chills, diaphoresis and fever.  HENT:  Positive for congestion. Negative for rhinorrhea and sore throat.   Respiratory:  Positive for chest tightness. Negative for apnea, cough, choking and shortness of breath.   Cardiovascular:  Positive for chest pain. Negative for leg swelling.  Neurological:  Negative for dizziness, seizures and syncope.   Physical Exam Updated Vital Signs BP 119/87 (BP Location: Right Arm)   Pulse 96   Temp 98.1 F (36.7 C) (Oral)   Resp 18   Ht 5' 9"$  (1.753 m)   Wt 107 kg   SpO2 94%   BMI 34.85 kg/m  Physical Exam Constitutional:      General: He is not in  acute distress.    Appearance: He is well-developed. He is obese. He is not toxic-appearing or diaphoretic.  HENT:     Head: Normocephalic and atraumatic.  Neck:     Vascular: No JVD.     Trachea: No tracheal deviation.  Cardiovascular:     Rate and Rhythm: Normal rate and regular rhythm.     Heart sounds: Normal heart sounds. Heart sounds not distant. No murmur heard. Pulmonary:     Effort: Pulmonary effort is normal. No tachypnea or respiratory distress.     Breath sounds: Normal breath  sounds.  Chest:     Chest wall: No mass or tenderness.  Abdominal:     Palpations: Abdomen is soft.  Musculoskeletal:     Cervical back: Normal range of motion.     Right lower leg: No tenderness. No edema.     Left lower leg: No tenderness. No edema.  Skin:    General: Skin is warm.     Capillary Refill: Capillary refill takes less than 2 seconds.  Neurological:     Mental Status: He is alert.    ED Results / Procedures / Treatments   Labs (all labs ordered are listed, but only abnormal results are displayed) Labs Reviewed  BASIC METABOLIC PANEL - Abnormal; Notable for the following components:      Result Value   Glucose, Bld 300 (*)    BUN 22 (*)    Creatinine, Ser 1.25 (*)    All other components within normal limits  TROPONIN I (HIGH SENSITIVITY) - Abnormal; Notable for the following components:   Troponin I (High Sensitivity) 25 (*)    All other components within normal limits  CBC  ETHANOL  RAPID URINE DRUG SCREEN, HOSP PERFORMED  HEMOGLOBIN A1C  LIPID PANEL  TSH  TROPONIN I (HIGH SENSITIVITY)    EKG EKG Interpretation  Date/Time:  Thursday May 08 2022 12:23:39 EST Ventricular Rate:  95 PR Interval:  145 QRS Duration: 88 QT Interval:  348 QTC Calculation: 438 R Axis:   91 Text Interpretation: Sinus rhythm Low voltage with right axis deviation No significant change since last tracing Confirmed by Isla Pence [phone]) on 05/08/2022 12:33:49 PM  Radiology DG Chest 2 View  Result Date: 05/08/2022 CLINICAL DATA:  Chest EXAM: CHEST - 2 VIEW COMPARISON:  12/10/2012 FINDINGS: Cardiac and mediastinal contours are within normal limits. No focal pulmonary opacity. No pleural effusion or pneumothorax. No acute osseous abnormality. IMPRESSION: No acute cardiopulmonary process. Electronically Signed   By: Merilyn Baba M.D.   On: 05/08/2022 11:56    Procedures Procedures   Medications Ordered in ED Medications - No data to display  ED Course/ Medical  Decision Making/ A&P   Click here for ABCD2, HEART and other calculatorsREFRESH Note before signing :1}      CHA2DS2-VASc Score: 2    HEART Score: 6                Medical Decision Making 12pm: Patient with c/o chest pain. CXR unremarkable.  EKG normal with no ST elevation or T wave inversion.  Awaiting results of EKG, BMP, cbc, troponin. Risk factors for CAD include 60-80-pack-year history of tobacco, uncontrolled diabetes with A1c 15, elevated cholesterol, hypertension.  1pm: Labs have resulted. Tropnin 25. CBC and ethanol negative normal.  BMP shows elevated glucose and creatinine, although unknown recent baseline creatinine. Spoke with cardiology, will plan to admit to hospitalist with cardiology consulted.   1:30pm: Report given to hospitalist. Plan for admission  to medicine.   Amount and/or Complexity of Data Reviewed External Data Reviewed: labs, radiology and ECG. Labs: ordered. Radiology: ordered.  Risk Decision regarding hospitalization.   Final Clinical Impression(s) / ED Diagnoses Final diagnoses:  Typical angina [place])    Rx / DC Orders ED Discharge Orders     None      Ezequiel Essex, MD   Ezequiel Essex, MD 05/08/22 [place], Julie, MD 05/18/22 1506

## 2022-05-08 NOTE — H&P (Signed)
History and Physical    Patient: Tony Leon:[id number] DOB: Jul 09, 1965 DOA: 05/08/2022 DOS: the patient was seen and examined on 05/08/2022 PCP: Tony Deaner, FNP  Patient coming from: Home  Chief Complaint:  Chief Complaint  Patient presents with   Chest Pain   HPI: Tony Leon is a 57 y.o. male with medical history significant of DM2, HTN, GERD, prostate CA. Presenting with chest pain. His had 3 - 4 episodes of midsternal "tightness" over the past week. It occasionally radiates to his back and up his neck. His initial episodes lasted about 15 minutes and were not associated with activity. His last 2 episodes lasted hours and were associated with light activity. He has not had any palpitations, lightheadedness/dizziness, dyspnea during these episodes. He has had some nausea and dry heaves. He reports an increase in stress lately but is not sure if he can really associate it with his current episodes. When his symptoms did not improve this morning, he decided to come to the ED for evaluation. He denies any other aggravating or alleviating factors.  Review of Systems: As mentioned in the history of present illness. All other systems reviewed and are negative. Past Medical History:  Diagnosis Date   Arthritis    Cancer ([place]) 12-10-12   prostate cancer, bx. done 3 weeks ago   COPD (chronic obstructive pulmonary disease) ([place])    Diabetes mellitus without complication (HCC)    type 2   Dyspnea    with exertion   GERD (gastroesophageal reflux disease)    Headache(784.0)    stress headaches   History of kidney stones 12-10-12   History of radiation therapy 11/08/15-01/03/16   prostate bed 68.4 Gy   Hypertension    Prostate cancer ([place])    surgery 2013 and radiation 2017   Urinary incontinence    Past Surgical History:  Procedure Laterality Date   CYSTOSCOPY N/A 10/16/2016   Procedure: CYSTOSCOPY WITH REMOVAL OF URETHRAL STONE;  Surgeon: Irine Seal, MD;  Location: [facility] ORS;   Service: Urology;  Laterality: N/A;   KIDNEY STONE SURGERY     x2   ROBOT ASSISTED LAPAROSCOPIC RADICAL PROSTATECTOMY N/A 12/15/2012   Procedure: ROBOTIC ASSISTED LAPAROSCOPIC RADICAL PROSTATECTOMY WITH LYMPH NODE DISSECTION ;  Surgeon: Malka So, MD;  Location: [facility] ORS;  Service: Urology;  Laterality: N/A;   WISDOM TOOTH EXTRACTION     Social History:  reports that he has been smoking cigarettes. He has a 45.00 pack-year smoking history. He has never used smokeless tobacco. He reports current alcohol use of about 12.0 standard drinks of alcohol per week. He reports current drug use. Drug: Marijuana.  No Known Allergies  Family History  Problem Relation Age of Onset   Prostate cancer Father     Prior to Admission medications   Medication Sig Start Date End Date Taking? Authorizing Provider  aspirin 325 MG tablet Take 325 mg by mouth daily.    [provider]  Chlorpheniramine Maleate (ALLERGY PO) Take 1 tablet by mouth daily.    [provider]  lisinopril (PRINIVIL,ZESTRIL) 10 MG tablet Take 10 mg by mouth daily. Takes at bedtime    [provider]  metFORMIN (GLUCOPHAGE) 500 MG tablet Take 500 mg by mouth 2 (two) times daily. 09/23/16   [provider]  omeprazole (PRILOSEC) 20 MG capsule Take 20 mg by mouth daily.  11/30/15   [provider]    Physical Exam: Vitals:   05/08/22 1140 05/08/22 1142 05/08/22  1221 05/08/22 1230  BP:  135/85 122/80 119/87  Pulse:   96 96  Resp:   16 18  Temp:   98.1 F (36.7 C)   TempSrc:   Oral   SpO2:   94% 94%  Weight: 107 kg     Height: '5\' 9"'$  (1.753 m)      General: 57 y.o. male resting in bed in NAD Eyes: PERRL, normal sclera ENMT: Nares patent w/o discharge, orophaynx clear, dentition normal, ears w/o discharge/lesions/ulcers Neck: Supple, trachea midline Cardiovascular: RRR, +S1, S2, no m/g/r, equal pulses throughout Respiratory: CTABL, no w/r/r, normal WOB GI: BS+, NDNT, no masses noted, no  organomegaly noted MSK: No e/c/c Neuro: A&O x 3, no focal deficits Psyc: Appropriate interaction and affect, calm/cooperative  Data Reviewed:  Results for orders placed or performed during the hospital encounter of 05/08/22 (from the past 24 hour(s))  Basic metabolic panel     Status: Abnormal   Collection Time: 05/08/22 11:45 AM  Result Value Ref Range   Sodium 135 135 - 145 mmol/L   Potassium 4.5 3.5 - 5.1 mmol/L   Chloride 98 98 - 111 mmol/L   CO2 24 22 - 32 mmol/L   Glucose, Bld 300 (H) 70 - 99 mg/dL   BUN 22 (H) 6 - 20 mg/dL   Creatinine, Ser 1.25 (H) 0.61 - 1.24 mg/dL   Calcium 9.2 8.9 - 10.3 mg/dL   GFR, Estimated >60 >60 mL/min   Anion gap 13 5 - 15  CBC     Status: None   Collection Time: 05/08/22 11:45 AM  Result Value Ref Range   WBC 5.5 4.0 - 10.5 K/uL   RBC 4.84 4.22 - 5.81 MIL/uL   Hemoglobin 16.1 13.0 - 17.0 g/dL   HCT 45.6 39.0 - 52.0 %   MCV 94.2 80.0 - 100.0 fL   MCH 33.3 26.0 - 34.0 pg   MCHC 35.3 30.0 - 36.0 g/dL   RDW 12.6 11.5 - 15.5 %   Platelets 176 150 - 400 K/uL   nRBC 0.0 0.0 - 0.2 %  Troponin I (High Sensitivity)     Status: Abnormal   Collection Time: 05/08/22 11:45 AM  Result Value Ref Range   Troponin I (High Sensitivity) 25 (H) <18 ng/L  Ethanol     Status: None   Collection Time: 05/08/22 12:18 PM  Result Value Ref Range   Alcohol, Ethyl (B) <10 <10 mg/dL  Rapid urine drug screen (hospital performed)     Status: None   Collection Time: 05/08/22 12:39 PM  Result Value Ref Range   Opiates NONE DETECTED NONE DETECTED   Cocaine NONE DETECTED NONE DETECTED   Benzodiazepines NONE DETECTED NONE DETECTED   Amphetamines NONE DETECTED NONE DETECTED   Tetrahydrocannabinol NONE DETECTED NONE DETECTED   Barbiturates NONE DETECTED NONE DETECTED   CXR: No acute cardiopulmonary process.   Assessment and Plan: Chest Pain     - place in obs, tele     - trend trp (initial 25)     - EKG: sinus, no st elevations     - check echo     - ASA,  statin     - check lipid panel     - cardiology onboard, appreciate assistance   Uncontrolled DM 2 Diabetic neuropathy     - check A1c     - SSI, DM diet, glucose checks  Tobacco abuse Marijuana abuse     - counsel against further use  HLD     -  statin     - check lipid panel  GERD     - PPI  HTN     - continue home regimen when confirmed  Advance Care Planning:   Code Status: FULL  Consults: Cardiology  Family Communication: None at bedside  Severity of Illness: The appropriate patient status for this patient is OBSERVATION. Observation status is judged to be reasonable and necessary in order to provide the required intensity of service to ensure the patient's safety. The patient's presenting symptoms, physical exam findings, and initial radiographic and laboratory data in the context of their medical condition is felt to place them at decreased risk for further clinical deterioration. Furthermore, it is anticipated that the patient will be medically stable for discharge from the hospital within 2 midnights of admission.   Author: Jonnie Finner, DO 05/08/2022 1:36 PM  For on call review [email or web address].

## 2022-05-09 ENCOUNTER — Encounter (HOSPITAL_COMMUNITY)
Admission: EM | Disposition: A | Discharge status: Home / Self Care | Payer: Self-pay | Source: Home / Self Care | Attending: Emergency Medicine

## 2022-05-09 ENCOUNTER — Observation Stay (HOSPITAL_BASED_OUTPATIENT_CLINIC_OR_DEPARTMENT_OTHER): Payer: BC Managed Care – PPO

## 2022-05-09 DIAGNOSIS — I10 Essential (primary) hypertension: Secondary | ICD-10-CM | POA: Diagnosis not present

## 2022-05-09 DIAGNOSIS — I25110 Atherosclerotic heart disease of native coronary artery with unstable angina pectoris: Secondary | ICD-10-CM | POA: Diagnosis not present

## 2022-05-09 DIAGNOSIS — E785 Hyperlipidemia, unspecified: Secondary | ICD-10-CM | POA: Diagnosis not present

## 2022-05-09 DIAGNOSIS — I200 Unstable angina: Secondary | ICD-10-CM

## 2022-05-09 DIAGNOSIS — E669 Obesity, unspecified: Secondary | ICD-10-CM

## 2022-05-09 DIAGNOSIS — E782 Mixed hyperlipidemia: Secondary | ICD-10-CM

## 2022-05-09 DIAGNOSIS — R079 Chest pain, unspecified: Secondary | ICD-10-CM

## 2022-05-09 DIAGNOSIS — E119 Type 2 diabetes mellitus without complications: Secondary | ICD-10-CM | POA: Diagnosis not present

## 2022-05-09 DIAGNOSIS — E1165 Type 2 diabetes mellitus with hyperglycemia: Secondary | ICD-10-CM | POA: Diagnosis not present

## 2022-05-09 DIAGNOSIS — I2510 Atherosclerotic heart disease of native coronary artery without angina pectoris: Secondary | ICD-10-CM | POA: Diagnosis not present

## 2022-05-09 DIAGNOSIS — Z794 Long term (current) use of insulin: Secondary | ICD-10-CM | POA: Diagnosis not present

## 2022-05-09 DIAGNOSIS — Z720 Tobacco use: Secondary | ICD-10-CM

## 2022-05-09 HISTORY — PX: LEFT HEART CATH AND CORONARY ANGIOGRAPHY: CATH118249

## 2022-05-09 HISTORY — DX: Unstable angina: I20.0

## 2022-05-09 LAB — BASIC METABOLIC PANEL
Anion gap: 7 (ref 5–15)
BUN: 20 mg/dL (ref 6–20)
CO2: 23 mmol/L (ref 22–32)
Calcium: 8.6 mg/dL — ABNORMAL LOW (ref 8.9–10.3)
Chloride: 101 mmol/L (ref 98–111)
Creatinine, Ser: 1.12 mg/dL (ref 0.61–1.24)
GFR, Estimated: >60 mL/min (ref >60)
Glucose, Bld: 284 mg/dL — ABNORMAL HIGH (ref 70–99)
Potassium: 4.3 mmol/L (ref 3.5–5.1)
Sodium: 131 mmol/L — ABNORMAL LOW (ref 135–145)

## 2022-05-09 LAB — ECHOCARDIOGRAM COMPLETE
Area-P 1/2: 3.45 cm2
Height: 69 in
S' Lateral: 3.5 cm
Weight: 3707.26 oz

## 2022-05-09 LAB — GLUCOSE, CAPILLARY
Glucose-Capillary: 300 mg/dL — ABNORMAL HIGH (ref 70–99)
Glucose-Capillary: 158 mg/dL — ABNORMAL HIGH (ref 70–99)
Glucose-Capillary: 137 mg/dL — ABNORMAL HIGH (ref 70–99)
Glucose-Capillary: 261 mg/dL — ABNORMAL HIGH (ref 70–99)

## 2022-05-09 LAB — HEPATIC FUNCTION PANEL
ALT: 45 U/L — ABNORMAL HIGH (ref 0–44)
AST: 26 U/L (ref 15–41)
Albumin: 3.8 g/dL (ref 3.5–5.0)
Alkaline Phosphatase: 54 U/L (ref 38–126)
Bilirubin, Direct: 0.1 mg/dL (ref 0.0–0.2)
Indirect Bilirubin: 0.8 mg/dL (ref 0.3–0.9)
Total Bilirubin: 0.9 mg/dL (ref 0.3–1.2)
Total Protein: 6.7 g/dL (ref 6.5–8.1)

## 2022-05-09 LAB — CBC
HCT: 47.1 % (ref 39.0–52.0)
Hemoglobin: 15.9 g/dL (ref 13.0–17.0)
MCH: 32.9 pg (ref 26.0–34.0)
MCHC: 33.8 g/dL (ref 30.0–36.0)
MCV: 97.3 fL (ref 80.0–100.0)
Platelets: 104 10*3/uL — ABNORMAL LOW (ref 150–400)
RBC: 4.84 MIL/uL (ref 4.22–5.81)
RDW: 12.7 % (ref 11.5–15.5)
WBC: 3.6 10*3/uL — ABNORMAL LOW (ref 4.0–10.5)
nRBC: 0 % (ref 0.0–0.2)

## 2022-05-09 LAB — LIPID PANEL
Cholesterol: 263 mg/dL — ABNORMAL HIGH (ref 0–200)
HDL: 32 mg/dL — ABNORMAL LOW (ref >40)
LDL Cholesterol: UNDETERMINED mg/dL (ref 0–99)
Total CHOL/HDL Ratio: 8.2 RATIO
Triglycerides: >400 mg/dL — ABNORMAL HIGH (ref <150)
VLDL: UNDETERMINED mg/dL (ref 0–40)

## 2022-05-09 LAB — LDL CHOLESTEROL, DIRECT: Direct LDL: 120 mg/dL — ABNORMAL HIGH (ref 0–99)

## 2022-05-09 LAB — CREATININE, SERUM
Creatinine, Ser: 1.36 mg/dL — ABNORMAL HIGH (ref 0.61–1.24)
GFR, Estimated: >60 mL/min (ref >60)

## 2022-05-09 SURGERY — LEFT HEART CATH AND CORONARY ANGIOGRAPHY
Anesthesia: LOCAL

## 2022-05-09 MED ORDER — ASPIRIN 81 MG PO TBEC
81.0000 mg | DELAYED_RELEASE_TABLET | Freq: Every day | ORAL | Status: DC
  Administered 2022-05-09 – 2022-05-10 (×2): 81 mg via ORAL
  Filled 2022-05-09 (×2): qty 1

## 2022-05-09 MED ORDER — MIDAZOLAM HCL 2 MG/2ML IJ SOLN
INTRAMUSCULAR | Status: DC | PRN
  Administered 2022-05-09: 2 mg via INTRAVENOUS

## 2022-05-09 MED ORDER — SODIUM CHLORIDE 0.9 % WEIGHT BASED INFUSION
3.0000 mL/kg/h | INTRAVENOUS | Status: DC
  Administered 2022-05-09: 3 mL/kg/h via INTRAVENOUS

## 2022-05-09 MED ORDER — HEPARIN SODIUM (PORCINE) 1000 UNIT/ML IJ SOLN
INTRAMUSCULAR | Status: AC
  Filled 2022-05-09: qty 10

## 2022-05-09 MED ORDER — HEPARIN (PORCINE) IN NACL 1000-0.9 UT/500ML-% IV SOLN
INTRAVENOUS | Status: AC
  Filled 2022-05-09: qty 1000

## 2022-05-09 MED ORDER — SODIUM CHLORIDE 0.9 % WEIGHT BASED INFUSION
1.0000 mL/kg/h | INTRAVENOUS | Status: AC
  Administered 2022-05-09: 1 mL/kg/h via INTRAVENOUS

## 2022-05-09 MED ORDER — VERAPAMIL HCL 2.5 MG/ML IV SOLN
INTRAVENOUS | Status: AC
  Filled 2022-05-09: qty 2

## 2022-05-09 MED ORDER — ISOSORBIDE MONONITRATE ER 30 MG PO TB24
30.0000 mg | ORAL_TABLET | Freq: Every day | ORAL | Status: DC
  Administered 2022-05-09 – 2022-05-10 (×2): 30 mg via ORAL
  Filled 2022-05-09 (×2): qty 1

## 2022-05-09 MED ORDER — SODIUM CHLORIDE 0.9% FLUSH
3.0000 mL | Freq: Two times a day (BID) | INTRAVENOUS | Status: DC
  Administered 2022-05-09 (×2): 3 mL via INTRAVENOUS

## 2022-05-09 MED ORDER — SODIUM CHLORIDE 0.9% FLUSH: 3.0000 mL | INTRAVENOUS | Status: DC | PRN

## 2022-05-09 MED ORDER — INSULIN GLARGINE-YFGN 100 UNIT/ML ~~LOC~~ SOLN
10.0000 [IU] | Freq: Every day | SUBCUTANEOUS | Status: DC
  Administered 2022-05-09: 10 [IU] via SUBCUTANEOUS
  Filled 2022-05-09 (×2): qty 0.1

## 2022-05-09 MED ORDER — LIDOCAINE HCL (PF) 1 % IJ SOLN
INTRAMUSCULAR | Status: DC | PRN
  Administered 2022-05-09: 2 mL

## 2022-05-09 MED ORDER — FENTANYL CITRATE (PF) 100 MCG/2ML IJ SOLN
INTRAMUSCULAR | Status: DC | PRN
  Administered 2022-05-09: 25 ug via INTRAVENOUS

## 2022-05-09 MED ORDER — IOHEXOL 350 MG/ML SOLN
INTRAVENOUS | Status: DC | PRN
  Administered 2022-05-09: 50 mL via INTRA_ARTERIAL

## 2022-05-09 MED ORDER — HEPARIN SODIUM (PORCINE) 1000 UNIT/ML IJ SOLN
INTRAMUSCULAR | Status: DC | PRN
  Administered 2022-05-09: 5000 [IU] via INTRAVENOUS

## 2022-05-09 MED ORDER — LIVING WELL WITH DIABETES BOOK
Freq: Once | Status: AC
  Filled 2022-05-09: qty 1

## 2022-05-09 MED ORDER — ATORVASTATIN CALCIUM 40 MG PO TABS
80.0000 mg | ORAL_TABLET | Freq: Every day | ORAL | Status: DC
  Administered 2022-05-10: 80 mg via ORAL
  Filled 2022-05-09: qty 2

## 2022-05-09 MED ORDER — SODIUM CHLORIDE 0.9% FLUSH
3.0000 mL | Freq: Two times a day (BID) | INTRAVENOUS | Status: DC
  Administered 2022-05-10: 3 mL via INTRAVENOUS

## 2022-05-09 MED ORDER — ATORVASTATIN CALCIUM 40 MG PO TABS: 80.0000 mg | ORAL_TABLET | Freq: Every day | ORAL | Status: DC

## 2022-05-09 MED ORDER — ICOSAPENT ETHYL 1 G PO CAPS
2.0000 g | ORAL_CAPSULE | Freq: Two times a day (BID) | ORAL | Status: DC
  Administered 2022-05-09 – 2022-05-10 (×2): 2 g via ORAL
  Filled 2022-05-09 (×2): qty 2

## 2022-05-09 MED ORDER — SODIUM CHLORIDE 0.9 % IV SOLN: 250.0000 mL | INTRAVENOUS | Status: DC | PRN

## 2022-05-09 MED ORDER — LIDOCAINE HCL (PF) 1 % IJ SOLN
INTRAMUSCULAR | Status: AC
  Filled 2022-05-09: qty 30

## 2022-05-09 MED ORDER — PERFLUTREN LIPID MICROSPHERE
1.0000 mL | INTRAVENOUS | Status: AC | PRN
  Administered 2022-05-09: 3 mL via INTRAVENOUS

## 2022-05-09 MED ORDER — SODIUM CHLORIDE 0.9 % WEIGHT BASED INFUSION
1.0000 mL/kg/h | INTRAVENOUS | Status: DC
  Administered 2022-05-09: 1 mL/kg/h via INTRAVENOUS

## 2022-05-09 MED ORDER — HEPARIN (PORCINE) IN NACL 1000-0.9 UT/500ML-% IV SOLN
INTRAVENOUS | Status: DC | PRN
  Administered 2022-05-09 (×2): 500 mL

## 2022-05-09 MED ORDER — VERAPAMIL HCL 2.5 MG/ML IV SOLN
INTRAVENOUS | Status: DC | PRN
  Administered 2022-05-09: 10 mL via INTRA_ARTERIAL

## 2022-05-09 MED ORDER — INSULIN ASPART 100 UNIT/ML IJ SOLN
3.0000 [IU] | Freq: Three times a day (TID) | INTRAMUSCULAR | Status: DC
  Administered 2022-05-10 (×2): 3 [IU] via SUBCUTANEOUS

## 2022-05-09 MED ORDER — METOPROLOL SUCCINATE ER 25 MG PO TB24
25.0000 mg | ORAL_TABLET | Freq: Every day | ORAL | Status: DC
  Administered 2022-05-09 – 2022-05-10 (×2): 25 mg via ORAL
  Filled 2022-05-09 (×2): qty 1

## 2022-05-09 MED ORDER — INSULIN STARTER KIT- PEN NEEDLES (ENGLISH)
1.0000 | Freq: Once | Status: AC
  Administered 2022-05-09: 1
  Filled 2022-05-09: qty 1

## 2022-05-09 MED ORDER — FENTANYL CITRATE (PF) 100 MCG/2ML IJ SOLN
INTRAMUSCULAR | Status: AC
  Filled 2022-05-09: qty 2

## 2022-05-09 MED ORDER — ATORVASTATIN CALCIUM 40 MG PO TABS
40.0000 mg | ORAL_TABLET | Freq: Once | ORAL | Status: AC
  Administered 2022-05-09: 40 mg via ORAL
  Filled 2022-05-09: qty 1

## 2022-05-09 MED ORDER — ENOXAPARIN SODIUM 40 MG/0.4ML IJ SOSY
40.0000 mg | PREFILLED_SYRINGE | INTRAMUSCULAR | Status: DC
  Administered 2022-05-10: 40 mg via SUBCUTANEOUS
  Filled 2022-05-09: qty 0.4

## 2022-05-09 MED ORDER — MIDAZOLAM HCL 2 MG/2ML IJ SOLN
INTRAMUSCULAR | Status: AC
  Filled 2022-05-09: qty 2

## 2022-05-09 SURGICAL SUPPLY — 9 items
CATH 5FR JL3.5 JR4 ANG PIG MP (CATHETERS) IMPLANT
DEVICE RAD COMP TR BAND LRG (VASCULAR PRODUCTS) IMPLANT
GLIDESHEATH SLEND SS 6F .021 (SHEATH) IMPLANT
GUIDEWIRE INQWIRE 1.5J.035X260 (WIRE) IMPLANT
INQWIRE 1.5J .035X260CM (WIRE) ×1
KIT HEART LEFT (KITS) ×1 IMPLANT
PACK CARDIAC CATHETERIZATION (CUSTOM PROCEDURE TRAY) ×1 IMPLANT
TRANSDUCER W/STOPCOCK (MISCELLANEOUS) ×1 IMPLANT
TUBING CIL FLEX 10 FLL-RA (TUBING) ×1 IMPLANT

## 2022-05-09 NOTE — Progress Notes (Signed)
  Transition of Care [facility]) Screening Note   Patient Details  Name: Tony Leon Date of Birth: 05-Sep-1965   Transition of Care [facility]) CM/SW Contact:    Henrietta Dine, RN Phone Number: 05/09/2022, 11:57 AM    Transition of Care Department [facility]) has reviewed patient and no TOC needs have been identified at this time. We will continue to monitor patient advancement through interdisciplinary progression rounds. If new patient transition needs arise, please place a TOC consult.

## 2022-05-09 NOTE — Progress Notes (Deleted)
Echocardiogram not complete per patient request. Will re-attempt as the schedule permits.   Darlina Sicilian RDCS

## 2022-05-09 NOTE — Progress Notes (Signed)
PROGRESS NOTE    Tony Leon  KPT:[id number] DOB: 03-03-1966 DOA: 05/08/2022 PCP: Herbert Deaner, FNP     Brief Narrative:  H/o localized prostate cancer status post radical prostatectomy in 2014, hypertension, hyperlipidemia, diabetes, Presenting with chest pain. His had 3 - 4 episodes of midsternal "tightness" over the past week. It occasionally radiates to his back and up his neck   Subjective:  Currently chest pain free, no sob  He is npo, awaiting for cardiac cath   Assessment & Plan:  Principal Problem:   Chest pain Active Problems:   DM2 (diabetes mellitus, type 2) (HCC)   Diabetic neuropathy (HCC)   HLD (hyperlipidemia)   GERD (gastroesophageal reflux disease)   Tobacco abuse   Marijuana abuse   HTN (hypertension)   Unstable angina (HCC)    Assessment and Plan:  Chest pain -With significant risk factors including uncontrolled diabetes, hypertension, hyperlipidemia -To have cardiac catheter today  Mixed hyperlipidemia with severe hypertriglyceridemia -Statin plus fenofibrate/Vascepa -Consider referral to lipid clinic  Abnormal cr, underline ckdII Repeat bmp   Uncontrolled type 2 diabetes with hyperglycemia -A1c 14.2 -On Synjardy XR 25 1000 daily at home -Need to initiate insulin therapy -Diabetes education -Diet education  Mild elevated LFT Report periodic alcohol use Check hepatitis panel  Tobacco abuse Marijuana abuse     - counsel against further use   Body mass index is 34.22 kg/m.Marland Kitchen  Meet obesity criteria, lifestyle modification       I have Reviewed nursing notes, Vitals, pain scores, I/o's, Lab results and  imaging results since pt's last encounter, details please see discussion above  I ordered the following labs:  Unresulted Labs (From admission, onward)     Start     Ordered   05/16/22 0500  Creatinine, serum  (enoxaparin (LOVENOX)    CrCl >/= 30 ml/min)  Weekly,   R     Comments: while on enoxaparin therapy    05/09/22  1811   05/10/22 0500  Comprehensive metabolic panel  Tomorrow morning,   R        05/09/22 0802   05/10/22 0500  CBC  Tomorrow morning,   R        05/09/22 0802   05/10/22 0500  Lipoprotein A (LPA)  Tomorrow morning,   R        05/09/22 1811   05/10/22 0500  Lipase, blood  Tomorrow morning,   R        05/09/22 1914   05/10/22 0500  Hepatitis panel, acute  Tomorrow morning,   R        05/09/22 1914   05/10/22 0500  CK  Tomorrow morning,   R        05/09/22 1914   05/10/22 0500  Magnesium  Tomorrow morning,   R        05/09/22 1914   05/09/22 1812  CBC  (enoxaparin (LOVENOX)    CrCl >/= 30 ml/min)  Once,   R       Comments: Baseline for enoxaparin therapy IF NOT ALREADY DRAWN.  Notify MD if PLT < 100 K.    05/09/22 1811   05/09/22 1812  Creatinine, serum  (enoxaparin (LOVENOX)    CrCl >/= 30 ml/min)  Once,   R       Comments: Baseline for enoxaparin therapy IF NOT ALREADY DRAWN.    05/09/22 1811             DVT prophylaxis: enoxaparin (LOVENOX) injection 40  mg Start: 05/10/22 0800   Code Status:   Code Status: Full Code  Family Communication: Patient Disposition:   Status is: Observation  Dispo: The patient is from: home, drove himself to the hospital              Anticipated d/c is to: home              Anticipated d/c date is: TBD, pending cardiac cath result  Antimicrobials:    Anti-infectives (From admission, onward)    None          Objective: Vitals:   05/09/22 1630 05/09/22 1645 05/09/22 1700 05/09/22 1814  BP: 125/79 (!) 141/89 (!) 144/81 122/73  Pulse: 88 97 84 85  Resp: 20 (!) '22 15 20  '$ Temp:    98 F (36.7 C)  TempSrc:    Oral  SpO2: 96% 95% 97% 97%  Weight:      Height:        Intake/Output Summary (Last 24 hours) at 05/09/2022 1919 Last data filed at 05/09/2022 1852 Gross per 24 hour  Intake 648.23 ml  Output --  Net 648.23 ml   Filed Weights   05/08/22 1140 05/08/22 1545 05/09/22 1216  Weight: 107 kg 105.6 kg 105.1 kg     Examination:  General exam: alert, awake, communicative,calm, NAD Respiratory system: Clear to auscultation. Respiratory effort normal. Cardiovascular system:  RRR.  Gastrointestinal system: Abdomen is nondistended, soft and nontender.  Normal bowel sounds heard. Central nervous system: Alert and oriented. No focal neurological deficits. Extremities:  no edema Skin: No rashes, lesions or ulcers Psychiatry: Judgement and insight appear normal. Mood & affect appropriate.     Data Reviewed: I have personally reviewed  labs and visualized  imaging studies since the last encounter and formulate the plan        Scheduled Meds:  aspirin EC  81 mg Oral Daily   [START ON 05/10/2022] atorvastatin  80 mg Oral Daily   [START ON 05/10/2022] enoxaparin (LOVENOX) injection  40 mg Subcutaneous Q24H   gabapentin  300 mg Oral Daily   icosapent Ethyl  2 g Oral BID   insulin aspart  0-15 Units Subcutaneous TID WC   insulin aspart  0-5 Units Subcutaneous QHS   [START ON 05/10/2022] insulin aspart  3 Units Subcutaneous TID WC   insulin glargine-yfgn  10 Units Subcutaneous QHS   isosorbide mononitrate  30 mg Oral Daily   loratadine  10 mg Oral Daily   metoprolol succinate  25 mg Oral Daily   pantoprazole  40 mg Oral Daily   sodium chloride flush  3 mL Intravenous Q12H   [START ON 05/10/2022] sodium chloride flush  3 mL Intravenous Q12H   Continuous Infusions:  [START ON 05/10/2022] sodium chloride     sodium chloride 1 mL/kg/hr (05/09/22 1627)     LOS: 0 days   Time spent:  58mns  FFlorencia Reasons MD PhD FACP [facility]  Available via Epic secure chat 7am-7pm for nonurgent issues Please page for urgent issues To page the attending provider between 7A-7P or the covering provider during after hours 7P-7A, please log into the web site [email or web address] and access using universal [place] password for that web site. If you do not have the password, please call the hospital  operator.    05/09/2022, 7:19 PM

## 2022-05-09 NOTE — Interval H&P Note (Signed)
History and Physical Interval Note:  05/09/2022 2:55 PM  [name]  has presented today for surgery, with the diagnosis of unstable angina.  The various methods of treatment have been discussed with the patient and family. After consideration of risks, benefits and other options for treatment, the patient has consented to  Procedure(s): LEFT HEART CATH AND CORONARY ANGIOGRAPHY (N/A) as a surgical intervention.  The patient's history has been reviewed, patient examined, no change in status, stable for surgery.  I have reviewed the patient's chart and labs.  Questions were answered to the patient's satisfaction.   Cath Lab Visit (complete for each Cath Lab visit)  Clinical Evaluation Leading to the Procedure:   ACS: Yes.    Non-ACS:    Anginal Classification: CCS III  Anti-ischemic medical therapy: No Therapy  Non-Invasive Test Results: No non-invasive testing performed  Prior CABG: No previous CABG        Collier Salina [facility] 05/09/2022 2:55 PM
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
## 2022-05-09 NOTE — Inpatient Diabetes Management (Signed)
Inpatient Diabetes Program Recommendations  AACE/ADA: New Consensus Statement on Inpatient Glycemic Control (2015)  Target Ranges:  Prepandial:   less than 140 mg/dL      Peak postprandial:   less than 180 mg/dL (1-2 hours)      Critically ill patients:  140 - 180 mg/dL   Lab Results  Component Value Date   GLUCAP 300 (H) 05/09/2022   HGBA1C 14.2 (H) 05/08/2022    Review of Glycemic Control  Diabetes history: DM2 Outpatient Diabetes medications: Synjardy 25-1000 mg QD Current orders for Inpatient glycemic control: Novolog 0-15 TID with meals and 0-5 HS  HgbA1C - 14.2% Will likely need insulin at discharge NPO for cardiac cath this afternoon  Inpatient Diabetes Program Recommendations:    Change Novolog to 0-20 Q4H while NPO  Add Semglee 15 units QHS  Will likely need meal coverage insulin when pt is taking po's. Consider Novolog 4 units TID with meals if eating > 50% (when diet restarts)  Spoke with pt this am on phone regarding his diabetes. Pt states he's been on Synjardy for over a year. Has PCP at [facility], Meade Maw, MD. Has never monitored his blood sugars and does not have glucose meter. Discussed importance of controlling blood sugars to reduce risk of complications. Also discussed HgbA1C of 14.2% and goal of 7%. Ordered Living Well with Diabetes book and Insulin pen starter kit, as will likely need insulin at home.   Will see pt either before or after cath today.  Thank you. Lorenda Peck, RD, LDN, [place] Inpatient Diabetes Coordinator [phone]

## 2022-05-09 NOTE — Progress Notes (Signed)
Pt report given to care link RN, Pt transported back to [facility].

## 2022-05-09 NOTE — Progress Notes (Addendum)
Received report from Dr. Martinique re: cath - he had moderate 3V nonobstructive disease. Dr. Martinique recommended to start medical therapy and added Imdur, metoprolol, and Vascepa. He has recommended to keep patient overnight for observation with probable DC tomorrow. Patient will be returning to [place] this evening after his observation period here. I asked the holding area nurse to make sure pt aware. I notified Dr. Erlinda Hong of update as well. I went ahead and scheduled him for f/u 2/13 with APP, appt info placed on AVS.

## 2022-05-09 NOTE — H&P (View-Only) (Signed)
Cardiology Consultation   Patient ID: Tony Leon MRN: [id number]; DOB: [birth year]  Admit date: 05/08/2022 Date of Consult: 05/09/2022  PCP:  Tony Leon, [facility] Cardiologist: New Click here to update MD or APP on Care Team, Refresh:1}     Patient Profile:   Tony Leon is a 57 y.o. male with a hx of DM2, HTN, GERD, mixed HLD with significant hypertriglyceridemia, prostate CA, periodic ETOH intake, tobacco abuse and periodic marijuana use who is being seen 05/09/2022 for the evaluation of chest pain at the request of Dr. Marylyn Leon.  History of Present Illness:   Tony Leon has no prior cardiac history. His sister died in her 47s of cardiac arrest but was using cocaine at the time. He has smoked since age 73. He uses occasional THC. He drinks periodically - may be a shot one day then a pint the next week, but denies every day use, sporadic. He was lost to follow-up with primary care after his PCP retired but has been taking Synjardy until a recent visit this week in [facility].  He has had a very stressful few weeks due to the expense and stress of the pipes bursting in his home from the cold snap recently. This finally got sorted out but about a week ago he began to have episodes of substernal chest tightness across his whole chest radiating to his jaw associated with nausea and dry heaving. 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 Each episode seemed to ease off when he would get back in his truck and rest. On Monday he had another episode where he had a pack of equipment on his back walking up a hill and felt recurrent chest tightness that persisted about 2-3 hours, improved with resting. This was not associated with any other symptoms. Of note, he was taking an OTC rx from [facility] called Bronch-Aid thinking it would open his airways to relieve the discomfort up until Monday when he took his last dose (contains  ephedrine). He established care with a PCP this week who called him to report his triglycerides were dangerously high and that if he had any recurrent symptoms, to report to the ER. Yesterday he another episode lasting about an hour precipitated by activity so came to the ER for evaluation. CP again resolved without intervention and he remains CP free at rest. EKG shows NSR with nonspecific STTW changes, no acute ischemia. hsTroponin 25->26, A1C 14.2, triglycerides 903, direct LDL (drawn twice - 137 then 120), UDS neg, CBC wnl, BUN 22/Cr 1.25.     Past Medical History:  Diagnosis Date   Arthritis    Cancer ([place]) 12-10-12   prostate cancer, bx. done 3 weeks ago   COPD (chronic obstructive pulmonary disease) ([place])    Diabetes mellitus without complication (HCC)    type 2   Dyspnea    with exertion   GERD (gastroesophageal reflux disease)    Headache(784.0)    stress headaches   History of kidney stones 12-10-12   History of radiation therapy 11/08/15-01/03/16   prostate bed 68.4 Gy   Hypertension    Prostate cancer ([place])    surgery 2013 and radiation 2017   Urinary incontinence     Past Surgical History:  Procedure Laterality Date   CYSTOSCOPY N/A 10/16/2016   Procedure: CYSTOSCOPY WITH REMOVAL OF URETHRAL STONE;  Surgeon: Irine Seal, MD;  Location: [facility];  Service: Urology;  Laterality: N/A;   KIDNEY STONE SURGERY     x2   ROBOT ASSISTED LAPAROSCOPIC RADICAL PROSTATECTOMY N/A 12/15/2012   Procedure: ROBOTIC ASSISTED LAPAROSCOPIC RADICAL PROSTATECTOMY WITH LYMPH NODE DISSECTION ;  Surgeon: Malka So, MD;  Location: [facility];  Service: Urology;  Laterality: N/A;   WISDOM TOOTH EXTRACTION       Home Medications:  Prior to Admission medications   Medication Sig Start Date End Date Taking? Authorizing Provider  aspirin 325 MG tablet Take 325 mg by mouth daily.   Yes [provider]  cetirizine (ZYRTEC) 10 MG tablet Take 10 mg by mouth daily.   Yes [provider]   gabapentin (NEURONTIN) 300 MG capsule Take 300 mg by mouth daily. 05/07/22  Yes [provider]  lisinopril (PRINIVIL,ZESTRIL) 10 MG tablet Take 10 mg by mouth daily.   Yes [provider]  pantoprazole (PROTONIX) 20 MG tablet Take 20 mg by mouth daily. 05/08/22  Yes [provider]  SYNJARDY XR 25-1000 MG TB24 Take 1 tablet by mouth daily. 03/30/22  Yes [provider]  atorvastatin (LIPITOR) 10 MG tablet Take by mouth. Patient not taking: Reported on 05/08/2022 05/08/22   [provider]    Inpatient Medications: Scheduled Meds:  aspirin  325 mg Oral Daily   atorvastatin  40 mg Oral Daily   enoxaparin (LOVENOX) injection  40 mg Subcutaneous Q24H   gabapentin  300 mg Oral Daily   insulin aspart  0-15 Units Subcutaneous TID WC   insulin aspart  0-5 Units Subcutaneous QHS   lisinopril  10 mg Oral Daily   loratadine  10 mg Oral Daily   pantoprazole  40 mg Oral Daily   Continuous Infusions:  PRN Meds: acetaminophen, ondansetron (ZOFRAN) IV  Allergies:   No Known Allergies  Social History:   Social History   Socioeconomic History   Marital status: Single    Spouse name: Not on file   Number of children: 0   Years of education: Not on file   Highest education level: Not on file  Occupational History   Occupation: Oceanographer  Tobacco Use   Smoking status: Every Day    Packs/day: 1.50    Years: 30.00    Total pack years: 45.00    Types: Cigarettes   Smokeless tobacco: Never  Vaping Use   Vaping Use: Never used  Substance and Sexual Activity   Alcohol use: Yes    Alcohol/week: 12.0 standard drinks of alcohol    Types: 12 Standard drinks or equivalent per week    Comment: liquor 3 x week   Drug use: Yes    Types: Marijuana    Comment: occ   Sexual activity: Yes  Other Topics Concern   Not on file  Social History Narrative   Not on file   Social Determinants of Health   Financial Resource Strain: Not on file  Food Insecurity:  No Food Insecurity (05/08/2022)   Hunger Vital Sign    Worried About Running Out of Food in the Last Year: Never true    Ran Out of Food in the Last Year: Never true  Transportation Needs: No Transportation Needs (05/08/2022)   PRAPARE - Hydrologist (Medical): No    Lack of Transportation (Non-Medical): No  Physical Activity: Not on file  Stress: Not on file  Social Connections: Not on file  Intimate Partner Violence: Not At Risk (05/08/2022)   Humiliation, Afraid, Rape, and Kick questionnaire  Fear of Current or Ex-Partner: No    Emotionally Abused: No    Physically Abused: No    Sexually Abused: No    Family History:   Family History  Problem Relation Age of Onset   Prostate cancer Father      ROS:  Please see the history of present illness.  All other ROS reviewed and negative.     Physical Exam/Data:   Vitals:   05/08/22 1545 05/08/22 1939 05/08/22 2327 05/09/22 0403  BP: 132/86 117/67 111/66 116/67  Pulse: 92 86 85 74  Resp: '14 16 18 16  '$ Temp: 97.6 F (36.4 C) 98.4 F (36.9 C) 98.2 F (36.8 C) 98.6 F (37 C)  TempSrc: Oral Oral Oral Oral  SpO2: 99% 96% 94% 95%  Weight: 105.6 kg     Height: '5\' 9"'$  (1.753 m)      No intake or output data in the 24 hours ending 05/09/22 0757    05/08/2022    3:45 PM 05/08/2022   11:40 AM 10/16/2016    8:51 AM  Last 3 Weights  Weight (lbs) 232 lb 12.9 oz 236 lb 265 lb  Weight (kg) 105.6 kg 107.049 kg 120.203 kg     Body mass index is 34.38 kg/m.  General: Well developed, well nourished M in no acute distress. Head: Normocephalic, atraumatic, sclera non-icteric, no xanthomas, nares are without discharge. Neck: Negative for carotid bruits. JVP not elevated. Lungs: Clear bilaterally to auscultation without wheezes, rales, or rhonchi. Breathing is unlabored. Heart: RRR S1 S2 without murmurs, rubs, or gallops.  Abdomen: Soft, non-tender, non-distended with normoactive bowel sounds. No  rebound/guarding. Extremities: No clubbing or cyanosis. No edema. Distal pedal pulses are 2+ and equal bilaterally. Neuro: Alert and oriented X 3. Moves all extremities spontaneously. Psych:  Responds to questions appropriately with a normal affect.   EKG:  The EKG was personally reviewed and demonstrates:  NSR 95bpm, lower voltage, nonspecific STTW changes. No STEMI. Telemetry:  Telemetry was personally reviewed and demonstrates:  NSR  Relevant CV Studies: None  Laboratory Data:  High Sensitivity Troponin:   Recent Labs  Lab 05/08/22 1145 05/08/22 1429  TROPONINIHS 25* 26*     Chemistry Recent Labs  Lab 05/08/22 1145  NA 135  K 4.5  CL 98  CO2 24  GLUCOSE 300*  BUN 22*  CREATININE 1.25*  CALCIUM 9.2  GFRNONAA >60  ANIONGAP 13    No results for input(s): "PROT", "ALBUMIN", "AST", "ALT", "ALKPHOS", "BILITOT" in the last 168 hours. Lipids  Recent Labs  Lab 05/09/22 0331  CHOL 263*  TRIG >400*  HDL 32*  LDLCALC UNABLE TO CALCULATE IF TRIGLYCERIDE OVER 400 mg/dL  CHOLHDL 8.2    Hematology Recent Labs  Lab 05/08/22 1145  WBC 5.5  RBC 4.84  HGB 16.1  HCT 45.6  MCV 94.2  MCH 33.3  MCHC 35.3  RDW 12.6  PLT 176   Thyroid  Recent Labs  Lab 05/08/22 1318  TSH 0.613    BNPNo results for input(s): "BNP", "PROBNP" in the last 168 hours.  DDimer No results for input(s): "DDIMER" in the last 168 hours.   Radiology/Studies:  DG Chest 2 View  Result Date: 05/08/2022 CLINICAL DATA:  Chest EXAM: CHEST - 2 VIEW COMPARISON:  12/10/2012 FINDINGS: Cardiac and mediastinal contours are within normal limits. No focal pulmonary opacity. No pleural effusion or pneumothorax. No acute osseous abnormality. IMPRESSION: No acute cardiopulmonary process. Electronically Signed   By: Merilyn Baba M.D.   On:  05/08/2022 11:56     Assessment and Plan:   1. Chest pain concerning for unstable angina with low-level troponin rise - discussed case with Dr. Gardiner Rhyme - given symptoms  concerning for angina and multiple cardiac risk factors (uncontrolled DM, longstanding tobacco, significant HLD, HTN), we are recommended cardiac catheterization for further evaluation - have added on schedule today - have decreased ASA to '81mg'$  daily (on '325mg'$  dose at home, took yesterday, but no data to support benefit of higher dose - denies being on the higher dose for a particular reason), already got this dose so will leave off pre-cath orders - depending on cath findings, consider addition of beta blocker - echo pending - restart atorvastatin in form of '80mg'$  daily - got '40mg'$  this AM, will give another '40mg'$  now and consolidate in AM - per review with MD, hold off full dose IV heparin unless he has recurrent pain since going for cath  Shared Decision Making/Informed Consent The risks [stroke (1 in 1000), death (1 in 1000), kidney failure [usually temporary] (1 in 500), bleeding (1 in 200), allergic reaction [possibly serious] (1 in 200)], benefits (diagnostic support and management of coronary artery disease) and alternatives of a cardiac catheterization were discussed in detail with Mr. Dolph and he is willing to proceed.  2. ?Acute kidney injury vs CKD stage 2 - admitting Cr 1.25 - previous value for comparison was 6 years ago at 1.0 so unclear baseline - check stat BMET, hold ACEI pending further trend - BP OK - addendum: repeat Cr 1.12, stable for cath - Na 131 but corrects to 135 (normal) for glucose  3. Mixed HLD with severe hypertriglyceridemia - statin as above - need to consider fibric acid derivative versus Vascepa if insurance will cover   4. DM2 - severely uncontrolled, onw on SSI - further rx per primary team  5. Tobacco, THC, periodic ETOH intake - counseled on cessation - reports he does not drink daily - sporadic  Risk Assessment/Risk Scores:     TIMI Risk Score for Unstable Angina or Non-ST Elevation MI:   The patient's TIMI risk score is  , which indicates a  % risk  of all cause mortality, new or recurrent myocardial infarction or need for urgent revascularization in the next 14 days.   For questions or updates, please contact [place] Please consult [email or web address] for contact info under    Signed, Charlie Pitter, PA-C  05/09/2022 7:57 AM  Patient seen and examined.  Agree with above documentation.  Mr. Vanness is a 57 year old male with a history of poorly controlled T2DM, hypertension, tobacco use, hyperlipidemia who we are consulted by Dr. Marylyn Leon for evaluation of chest pain.  He reports that he has been having exertional chest pain over the last few months.  States that when he is walking up a hill or doing something active at his job will note substernal chest tightness, resolves with rest.  This week has had episodes of resting pain lasting hours.  Reports had episode yesterday that lasted about an hour.  Given this prompted him to come into the ED.  Initial vital signs notable for BP 135/85, pulse 96, SpO2 94% on room air.  Labs notable for creatinine 1.25, sodium 135, potassium 4.5, troponin 25 > 26, triglycerides 903, hemoglobin 16.1, platelets 176, A1c 14.2.  EKG shows sinus rhythm, rate 95, low voltage, nonspecific T wave flattening.  Echocardiogram shows normal biventricular function, grade 1 diastolic dysfunction, no significant valvular disease.  Chest  x-ray unremarkable.  On exam, patient is alert and oriented, regular rate and rhythm, no murmurs, lungs CTAB, no LE edema or JVD.  For his chest pain, had episode of resting pain for 1 hour yesterday but troponins are mildly elevated and flat, not consistent with acute coronary syndrome.  However his recent exertional substernal chest pressure is concerning for angina.  He has significant CAD risk factors, including markedly uncontrolled diabetes (A1c 14.2) and reports has smoked 1 to 2 packs/day for nearly 40 years.  Recommend cardiac catheterization to evaluate for obstructive CAD.  Donato Heinz, MD

## 2022-05-09 NOTE — Progress Notes (Signed)
TR band removed from right radial site at 1700, site level 0. Dressed with gauze and tegaderm. Palpable radial pulse. Appropriate pleth waveform. Hands and fingers warm and pink. Sensation intact. Instructions explained to patient. Patient verbalizes understanding.

## 2022-05-10 DIAGNOSIS — I2510 Atherosclerotic heart disease of native coronary artery without angina pectoris: Secondary | ICD-10-CM

## 2022-05-10 DIAGNOSIS — E119 Type 2 diabetes mellitus without complications: Secondary | ICD-10-CM

## 2022-05-10 DIAGNOSIS — E782 Mixed hyperlipidemia: Secondary | ICD-10-CM | POA: Diagnosis not present

## 2022-05-10 DIAGNOSIS — Z794 Long term (current) use of insulin: Secondary | ICD-10-CM

## 2022-05-10 HISTORY — DX: Atherosclerotic heart disease of native coronary artery without angina pectoris: I25.10

## 2022-05-10 LAB — COMPREHENSIVE METABOLIC PANEL
ALT: 34 U/L (ref 0–44)
AST: 21 U/L (ref 15–41)
Albumin: 3.3 g/dL — ABNORMAL LOW (ref 3.5–5.0)
Alkaline Phosphatase: 40 U/L (ref 38–126)
Anion gap: 9 (ref 5–15)
BUN: 17 mg/dL (ref 6–20)
CO2: 21 mmol/L — ABNORMAL LOW (ref 22–32)
Calcium: 8.4 mg/dL — ABNORMAL LOW (ref 8.9–10.3)
Chloride: 104 mmol/L (ref 98–111)
Creatinine, Ser: 1.09 mg/dL (ref 0.61–1.24)
GFR, Estimated: >60 mL/min (ref >60)
Glucose, Bld: 252 mg/dL — ABNORMAL HIGH (ref 70–99)
Potassium: 4.2 mmol/L (ref 3.5–5.1)
Sodium: 134 mmol/L — ABNORMAL LOW (ref 135–145)
Total Bilirubin: 0.8 mg/dL (ref 0.3–1.2)
Total Protein: 6.1 g/dL — ABNORMAL LOW (ref 6.5–8.1)

## 2022-05-10 LAB — CK: Total CK: 73 U/L (ref 49–397)

## 2022-05-10 LAB — CBC
HCT: 43.2 % (ref 39.0–52.0)
Hemoglobin: 14.6 g/dL (ref 13.0–17.0)
MCH: 32.7 pg (ref 26.0–34.0)
MCHC: 33.8 g/dL (ref 30.0–36.0)
MCV: 96.6 fL (ref 80.0–100.0)
Platelets: 173 10*3/uL (ref 150–400)
RBC: 4.47 MIL/uL (ref 4.22–5.81)
RDW: 12.4 % (ref 11.5–15.5)
WBC: 4.1 10*3/uL (ref 4.0–10.5)
nRBC: 0 % (ref 0.0–0.2)

## 2022-05-10 LAB — HEPATITIS PANEL, ACUTE
HCV Ab: NONREACTIVE
Hep A IgM: NONREACTIVE
Hep B C IgM: NONREACTIVE
Hepatitis B Surface Ag: NONREACTIVE

## 2022-05-10 LAB — MAGNESIUM: Magnesium: 2.2 mg/dL (ref 1.7–2.4)

## 2022-05-10 LAB — GLUCOSE, CAPILLARY
Glucose-Capillary: 233 mg/dL — ABNORMAL HIGH (ref 70–99)
Glucose-Capillary: 207 mg/dL — ABNORMAL HIGH (ref 70–99)
Glucose-Capillary: 223 mg/dL — ABNORMAL HIGH (ref 70–99)

## 2022-05-10 LAB — LIPASE, BLOOD: Lipase: 46 U/L (ref 11–51)

## 2022-05-10 MED ORDER — FENOFIBRATE 160 MG PO TABS: 160.0000 mg | ORAL_TABLET | Freq: Every day | ORAL | Status: DC

## 2022-05-10 MED ORDER — LANCETS MISC. MISC: 1.0000 | Freq: Three times a day (TID) | 0 refills | Status: DC

## 2022-05-10 MED ORDER — ISOSORBIDE MONONITRATE ER 30 MG PO TB24: 30.0000 mg | ORAL_TABLET | Freq: Every day | ORAL | 0 refills | Status: DC

## 2022-05-10 MED ORDER — LISINOPRIL 5 MG PO TABS: 5.0000 mg | ORAL_TABLET | Freq: Every day | ORAL | 0 refills | Status: DC

## 2022-05-10 MED ORDER — FENOFIBRATE 160 MG PO TABS: 160.0000 mg | ORAL_TABLET | Freq: Every day | ORAL | 0 refills | Status: DC

## 2022-05-10 MED ORDER — LANCET DEVICE MISC: 1.0000 | Freq: Three times a day (TID) | 0 refills | Status: DC

## 2022-05-10 MED ORDER — "PEN NEEDLES 1/2"" 29G X 12MM MISC": 0 refills | Status: DC

## 2022-05-10 MED ORDER — INSULIN DETEMIR 100 UNIT/ML FLEXPEN: 8.0000 [IU] | PEN_INJECTOR | Freq: Every day | SUBCUTANEOUS | 0 refills | Status: AC

## 2022-05-10 MED ORDER — BLOOD GLUCOSE MONITORING SUPPL DEVI: 1.0000 | Freq: Three times a day (TID) | 0 refills | Status: DC

## 2022-05-10 MED ORDER — INSULIN ASPART 100 UNIT/ML FLEXPEN: PEN_INJECTOR | SUBCUTANEOUS | 0 refills | Status: AC

## 2022-05-10 MED ORDER — ATORVASTATIN CALCIUM 80 MG PO TABS: 80.0000 mg | ORAL_TABLET | Freq: Every day | ORAL | 0 refills | Status: DC

## 2022-05-10 MED ORDER — ICOSAPENT ETHYL 1 G PO CAPS: 2.0000 g | ORAL_CAPSULE | Freq: Two times a day (BID) | ORAL | 0 refills | Status: DC

## 2022-05-10 MED ORDER — BLOOD GLUCOSE TEST VI STRP: 1.0000 | ORAL_STRIP | Freq: Three times a day (TID) | 0 refills | Status: DC

## 2022-05-10 MED ORDER — METOPROLOL SUCCINATE ER 25 MG PO TB24: 25.0000 mg | ORAL_TABLET | Freq: Every day | ORAL | 0 refills | Status: DC

## 2022-05-10 MED ORDER — ASPIRIN 81 MG PO TBEC: 81.0000 mg | DELAYED_RELEASE_TABLET | Freq: Every day | ORAL | 12 refills | Status: AC

## 2022-05-10 NOTE — Discharge Summary (Addendum)
Discharge Summary  Tony Leon:[id number] DOB: Oct 16, 1965  PCP: Tony Deaner, FNP  Admit date: 05/08/2022 Discharge date: 05/10/2022    Time spent: 60mns, more than 50% time spent on coordination of care.   Recommendations for Outpatient Follow-up:  F/u with PCP within a week  for hospital discharge follow up, repeat cbc/bmp at follow up F/u with cardiology as scheduled (cardiology to follow up on LPA result,  to consider referral to lipid clinic ) Outpatient diabetes education referral made Outpatient lung cancer screening CT referral made       Discharge Diagnoses:  Active Hospital Problems   Diagnosis Date Noted   Chest pain 05/08/2022   Coronary artery disease involving native coronary artery of native heart without angina pectoris 05/10/2022   [place]stable [place]ina ([facility] 05/09/2022   DM[place] ([place], type 2) [place] 05/08/[place]22   Diabetic neuropathy ([facility] 05/08/2022   HLD (hyperlipidemia) 05/08/2022   GERD (gastroesophageal reflux disease) 05/08/2022   Tobacco abuse 05/08/2022   Marijuana abuse 05/08/2022   HTN (hypertension) 05/08/2022    Resolved Hospital Problems  No resolved problems to display.    Discharge Condition: stable  Diet recommendation: heart healthy/carb modified  Filed Weights   05/08/22 1140 05/08/22 1545 05/09/22 1216  Weight: 107 kg 105.6 kg 105.1 kg    History of present illness: ( per admitting provider Tony Leon HPI: Tony REHBERGis a 57y.o. male with medical history significant of DM2, HTN, GERD, prostate CA. Presenting with chest pain. His had 3 - 4 episodes of midsternal "tightness" over the past week. It occasionally radiates to his back and up his neck. His initial episodes lasted about 15 minutes and were not associated with activity. His last 2 episodes lasted hours and were associated with light activity. He has not had any palpitations, lightheadedness/dizziness, dyspnea during these episodes. He has had some nausea and  dry heaves. He reports an increase in stress lately but is not sure if he can really associate it with his current episodes. When his symptoms did not improve this morning, he decided to come to the ED for evaluation. He denies any other aggravating or alleviating factors.   Hospital Course:  Principal Problem:   Chest pain Active Problems:   DM2 (diabetes mellitus, type 2) (HCC)   Diabetic neuropathy (HCC)   HLD (hyperlipidemia)   GERD (gastroesophageal reflux disease)   Tobacco abuse   Marijuana abuse   HTN (hypertension)   Unstable angina (HCC)   Coronary artery disease involving native coronary artery of native heart without angina pectoris   Assessment and Plan:  Chest pain -With significant risk factors including uncontrolled diabetes, hypertension, hyperlipidemia -s/p cardiac cath with moderate 3V nonobstructive disease. cardiology recommended to start medical therapy , discharge on asa, Imdur, metoprolol, and Vascepa, lipitor  -f/u with cardiology on 2/13   Mixed hyperlipidemia with severe hypertriglyceridemia -Statin plus Vascepa -f/u with cardiology, Consider referral to lipid clinic  Addendum: patient states vascepa is too expensive, request change meds, changed to fenobibrate , f/u with cardiology    AKI on CKDII Cr peaked at 1.36, normalized repeat bmp at hospital discharge follow up  HTN Bp stable on low dose imdur, low dose metoprolol, decrease lisinopril  F/u with pcp     Uncontrolled type 2 diabetes with hyperglycemia -A1c 14.2 -On Synjardy XR 25 1000 daily at home -initiate insulin therapy -Diabetes education -Diet education -F/u with pcp   Mild elevated LFT Report periodic alcohol use Negative hepatitis panel  Lft normalized on repeat    Tobacco abuse Marijuana abuse     - counsel against further use    Body mass index is 34.22 kg/m.Marland Kitchen  Meet obesity criteria, lifestyle modification     Discharge Exam: BP 120/71 (BP Location: Left Arm)    Pulse 82   Temp 98.5 F (36.9 C) (Oral)   Resp 16   Ht '5\' 9"'$  (1.753 m)   Wt 105.1 kg   SpO2 97%   BMI 34.22 kg/m   General: NAD, pleasant  Cardiovascular: RRR Respiratory: normal respiratory effort     Discharge Instructions     Ambulatory Referral for Lung Cancer Scre   Complete by: As directed    Ambulatory referral to Nutrition and Diabetic Education   Complete by: As directed    Diet - low sodium heart healthy   Complete by: As directed    Carb modified diet   Increase activity slowly   Complete by: As directed       Allergies as of 05/10/2022   No Known Allergies      Medication List     STOP taking these medications    aspirin 325 MG tablet Replaced by: aspirin EC 81 MG tablet       TAKE these medications    aspirin EC 81 MG tablet Take 1 tablet (81 mg total) by mouth daily. Swallow whole. Start taking on: May 11, 2022 Replaces: aspirin 325 MG tablet   atorvastatin 80 MG tablet Commonly known as: LIPITOR Take 1 tablet (80 mg total) by mouth daily. Start taking on: May 11, 2022 What changed:  medication strength how much to take when to take this   Blood Glucose Monitoring Suppl Devi 1 each by Does not apply route in the morning, at noon, and at bedtime. May substitute to any manufacturer covered by patient's insurance.   BLOOD GLUCOSE TEST STRIPS Strp 1 each by In Vitro route in the morning, at noon, and at bedtime. May substitute to any manufacturer covered by patient's insurance.   cetirizine 10 MG tablet Commonly known as: ZYRTEC Take 10 mg by mouth daily.   fenofibrate 160 MG tablet Take 1 tablet (160 mg total) by mouth daily. Start taking on: May 11, 2022   gabapentin 300 MG capsule Commonly known as: NEURONTIN Take 300 mg by mouth daily.   insulin aspart 100 UNIT/ML FlexPen Commonly known as: NOVOLOG Before each meal 3 times a day, 140-199 - 2 units, 200-250 - 4 units, 251-299 - 6 units,  300-349 - 8 units,  350  or above 10 units. Insulin PEN if approved, provide syringes and needles if needed.   insulin detemir 100 UNIT/ML FlexPen Commonly known as: LEVEMIR Inject 8 Units into the skin at bedtime.   isosorbide mononitrate 30 MG 24 hr tablet Commonly known as: IMDUR Take 1 tablet (30 mg total) by mouth at bedtime.   Lancet Device Misc 1 each by Does not apply route in the morning, at noon, and at bedtime. May substitute to any manufacturer covered by patient's insurance.   Lancets Misc. Misc 1 each by Does not apply route in the morning, at noon, and at bedtime. May substitute to any manufacturer covered by patient's insurance.   lisinopril 5 MG tablet Commonly known as: ZESTRIL Take 1 tablet (5 mg total) by mouth daily. What changed:  medication strength how much to take   metoprolol succinate 25 MG 24 hr tablet Commonly known as: TOPROL-XL Take 1 tablet (25 mg  total) by mouth daily. Start taking on: May 11, 2022   pantoprazole 20 MG tablet Commonly known as: PROTONIX Take 20 mg by mouth daily.   PEN NEEDLES 29GX1/2" 29G X 12MM Misc For insulin dependent diabetes   Synjardy XR 25-1000 MG Tb24 Generic drug: Empagliflozin-metFORMIN HCl ER Take 1 tablet by mouth daily.       No Known Allergies  Follow-up Information     Deberah Pelton, NP Follow up.   Specialty: Cardiology Why: [facility] - [facility] location - cardiology follow-up arranged on Tuesday May 20, 2022 at 10:55 AM (Arrive by 10:40 AM). Denyse Amass is one of our nurse practitioners that works with Tony. Gardiner Rhyme and our cardiology team. Contact information: [address] [place] [address] [phone]         Tony Deaner, FNP Follow up in 1 week(s).   Specialty: Nurse Practitioner Why: follow up with your pcp, repeat basic lab works at follow up, pcp to monitor your blood glucose and your cholesterol level please check your blood glucose three times a day, bring in record for your  pcp to review, please discuss with your pcp regarding continuous blood glucose monitoring device. further insulin adjustment per pcp. Contact information: [place] [address] [phone]                  The results of significant diagnostics from this hospitalization (including imaging, microbiology, ancillary and laboratory) are listed below for reference.    Significant Diagnostic Studies: CARDIAC CATHETERIZATION  Result Date: 05/09/2022   Ost LAD to Prox LAD lesion is 50% stenosed.   Mid LAD lesion is 70% stenosed.   Mid Cx-1 lesion is 70% stenosed with 70% stenosed side branch in 1st Mrg.   Mid Cx-2 lesion is 70% stenosed.   Prox RCA lesion is 60% stenosed.   LV end diastolic pressure is normal. Moderate 3 vessel CAD. There is a 70% stenosis in the mid to distal LAD. 70 bifurcation stenosis in the LCx at OM1 and OM2. The proximal RCA lesion appears ulcerative but not obstructive. Normal LVEDP Plan: recommend aggressive medical therapy and risk factor modification.   ECHOCARDIOGRAM COMPLETE  Result Date: 05/09/2022    ECHOCARDIOGRAM REPORT   Patient Name:   SILER MAVIS Date of Exam: 05/09/2022 Medical Rec #:  [id number]     Height:       69.0 in Accession #:    [id number]    Weight:       231.7 lb Date of Birth:  [birth year]     BSA:          2.199 m Patient Age:    74 years      BP:           114/77 mmHg Patient Gender: M             HR:           72 bpm. Exam Location:  Inpatient Procedure: 2D Echo, Color Doppler, Cardiac Doppler and Intracardiac            Opacification Agent Indications:    Chest Pain R07.9  History:        Patient has no prior history of Echocardiogram examinations.                 COPD; Risk Factors:Hypertension and Diabetes. GERD.  Sonographer:    Darlina Sicilian RDCS Referring Phys: [id number] [place]  1. Left ventricular ejection  fraction, by estimation, is 60 to 65%. The left ventricle has normal function. The left ventricle has no  regional wall motion abnormalities. There is mild concentric left ventricular hypertrophy. Left ventricular diastolic parameters are consistent with Grade I diastolic dysfunction (impaired relaxation).  2. Right ventricular systolic function is normal. The right ventricular size is normal.  3. The mitral valve is grossly normal. No evidence of mitral valve regurgitation. No evidence of mitral stenosis.  4. The aortic valve is tricuspid. Aortic valve regurgitation is not visualized. No aortic stenosis is present.  5. The inferior vena cava is normal in size with greater than 50% respiratory variability, suggesting right atrial pressure of 3 mmHg. FINDINGS  Left Ventricle: Left ventricular ejection fraction, by estimation, is 60 to 65%. The left ventricle has normal function. The left ventricle has no regional wall motion abnormalities. Definity contrast agent was given IV to delineate the left ventricular  endocardial borders. The left ventricular internal cavity size was normal in size. There is mild concentric left ventricular hypertrophy. Left ventricular diastolic parameters are consistent with Grade I diastolic dysfunction (impaired relaxation). Right Ventricle: The right ventricular size is normal. No increase in right ventricular wall thickness. Right ventricular systolic function is normal. Left Atrium: Left atrial size was normal in size. Right Atrium: Right atrial size was normal in size. Pericardium: Trivial pericardial effusion is present. The pericardial effusion is circumferential. Mitral Valve: The mitral valve is grossly normal. No evidence of mitral valve regurgitation. No evidence of mitral valve stenosis. Tricuspid Valve: The tricuspid valve is grossly normal. Tricuspid valve regurgitation is not demonstrated. No evidence of tricuspid stenosis. Aortic Valve: The aortic valve is tricuspid. Aortic valve regurgitation is not visualized. No aortic stenosis is present. Pulmonic Valve: The pulmonic valve  was grossly normal. Pulmonic valve regurgitation is not visualized. No evidence of pulmonic stenosis. Aorta: The aortic root and ascending aorta are structurally normal, with no evidence of dilitation. Venous: The inferior vena cava is normal in size with greater than 50% respiratory variability, suggesting right atrial pressure of 3 mmHg. IAS/Shunts: No atrial level shunt detected by color flow Doppler.  LEFT VENTRICLE PLAX 2D LVIDd:         4.90 cm   Diastology LVIDs:         3.50 cm   LV e' medial:    5.93 cm/s LV PW:         1.20 cm   LV E/e' medial:  9.5 LV IVS:        1.20 cm   LV e' lateral:   11.20 cm/s LVOT diam:     2.10 cm   LV E/e' lateral: 5.1 LV SV:         61 LV SV Index:   28 LVOT Area:     3.46 cm  RIGHT VENTRICLE RV S prime:     11.70 cm/s TAPSE (M-mode): 1.7 cm LEFT ATRIUM             Index        RIGHT ATRIUM          Index LA diam:        3.40 cm 1.55 cm/m   RA Area:     7.37 cm LA Vol (A2C):   39.4 ml 17.91 ml/m  RA Volume:   12.30 ml 5.59 ml/m LA Vol (A4C):   32.9 ml 14.96 ml/m LA Biplane Vol: 37.6 ml 17.10 ml/m  AORTIC VALVE LVOT Vmax:   96.60 cm/s LVOT Vmean:  70.000 cm/s LVOT VTI:    0.176 m  AORTA Ao Root diam: 3.50 cm Ao Asc diam:  3.10 cm MITRAL VALVE MV Area (PHT): 3.45 cm    SHUNTS MV Decel Time: 220 msec    Systemic VTI:  0.18 m MV E velocity: 56.60 cm/s  Systemic Diam: 2.10 cm MV A velocity: 54.40 cm/s MV E/A ratio:  1.04 Jenkins Rouge MD Electronically signed by Jenkins Rouge MD Signature Date/Time: 05/09/2022/1:37:28 PM    Final    DG Chest 2 View  Result Date: 05/08/2022 CLINICAL DATA:  Chest EXAM: CHEST - 2 VIEW COMPARISON:  12/10/2012 FINDINGS: Cardiac and mediastinal contours are within normal limits. No focal pulmonary opacity. No pleural effusion or pneumothorax. No acute osseous abnormality. IMPRESSION: No acute cardiopulmonary process. Electronically Signed   By: Merilyn Baba M.D.   On: 05/08/2022 11:56    Microbiology: No results found for this or any previous  visit (from the past 240 hour(s)).   Labs: Basic Metabolic Panel: Recent Labs  Lab 05/08/22 1145 05/09/22 0818 05/09/22 2040 05/10/22 0401 05/10/22 0829  NA 135 131*  --   --  134*  K 4.5 4.3  --   --  4.2  CL 98 101  --   --  104  CO2 24 23  --   --  21*  GLUCOSE 300* 284*  --   --  252*  BUN 22* 20  --   --  17  CREATININE 1.25* 1.12 1.36*  --  1.09  CALCIUM 9.2 8.6*  --   --  8.4*  MG  --   --   --  2.2  --    Liver Function Tests: Recent Labs  Lab 05/09/22 0818 05/10/22 0829  AST 26 21  ALT 45* 34  ALKPHOS 54 40  BILITOT 0.9 0.8  PROT 6.7 6.1*  ALBUMIN 3.8 3.3*   Recent Labs  Lab 05/10/22 0401  LIPASE 46   No results for input(s): "AMMONIA" in the last 168 hours. CBC: Recent Labs  Lab 05/08/22 1145 05/09/22 2040 05/10/22 0829  WBC 5.5 3.6* 4.1  HGB 16.1 15.9 14.6  HCT 45.6 47.1 43.2  MCV 94.2 97.3 96.6  PLT 176 104* 173   Cardiac Enzymes: Recent Labs  Lab 05/10/22 0401  CKTOTAL 73   BNP: BNP (last 3 results) No results for input(s): "BNP" in the last 8760 hours.  ProBNP (last 3 results) No results for input(s): "PROBNP" in the last 8760 hours.  CBG: Recent Labs  Lab 05/09/22 1437 05/09/22 2044 05/10/22 0453 05/10/22 0728 05/10/22 1105  GLUCAP 137* 261* 233* 207* 223*    FURTHER DISCHARGE INSTRUCTIONS:   Get Medicines reviewed and adjusted: Please take all your medications with you for your next visit with your Primary MD   Laboratory/radiological data: Please request your Primary MD to go over all hospital tests and procedure/radiological results at the follow up, please ask your Primary MD to get all Hospital records sent to his/her office.   In some cases, they will be blood work, cultures and biopsy results pending at the time of your discharge. Please request that your primary care M.D. goes through all the records of your hospital data and follows up on these results.   Also Note the following: If you experience worsening  of your admission symptoms, develop shortness of breath, life threatening emergency, suicidal or homicidal thoughts you must seek medical attention immediately by calling 911 or calling your MD immediately  if  symptoms less severe.   You must read complete instructions/literature along with all the possible adverse reactions/side effects for all the Medicines you take and that have been prescribed to you. Take any new Medicines after you have completely understood and accpet all the possible adverse reactions/side effects.    Do not drive when taking Pain medications or sleeping medications (Benzodaizepines)   Do not take more than prescribed Pain, Sleep and Anxiety Medications. It is not advisable to combine anxiety,sleep and pain medications without talking with your primary care practitioner   Special Instructions: If you have smoked or chewed Tobacco  in the last 2 yrs please stop smoking, stop any regular Alcohol  and or any Recreational drug use.   Wear Seat belts while driving.   Please note: You were cared for by a hospitalist during your hospital stay. Once you are discharged, your primary care physician will handle any further medical issues. Please note that NO REFILLS for any discharge medications will be authorized once you are discharged, as it is imperative that you return to your primary care physician (or establish a relationship with a primary care physician if you do not have one) for your post hospital discharge needs so that they can reassess your need for medications and monitor your lab values.     Signed:  Florencia Reasons MD, PhD, FACP  [facility] 05/10/2022, 1:28 PM

## 2022-05-10 NOTE — Progress Notes (Signed)
Rounding Note    Patient Name: Tony Leon Date of Encounter: 05/10/2022  [facility] Cardiologist: New  Subjective   Patient denies CP  Breathing is OK  Inpatient Medications    Scheduled Meds:  aspirin EC  81 mg Oral Daily   atorvastatin  80 mg Oral Daily   enoxaparin (LOVENOX) injection  40 mg Subcutaneous Q24H   gabapentin  300 mg Oral Daily   icosapent Ethyl  2 g Oral BID   insulin aspart  0-15 Units Subcutaneous TID WC   insulin aspart  0-5 Units Subcutaneous QHS   insulin aspart  3 Units Subcutaneous TID WC   insulin glargine-yfgn  10 Units Subcutaneous QHS   isosorbide mononitrate  30 mg Oral Daily   loratadine  10 mg Oral Daily   metoprolol succinate  25 mg Oral Daily   pantoprazole  40 mg Oral Daily   sodium chloride flush  3 mL Intravenous Q12H   sodium chloride flush  3 mL Intravenous Q12H   Continuous Infusions:  sodium chloride     PRN Meds: sodium chloride, acetaminophen, ondansetron (ZOFRAN) IV, sodium chloride flush   Vital Signs    Vitals:   05/09/22 1814 05/09/22 2046 05/10/22 0455 05/10/22 0856  BP: 122/73 115/79 113/80 120/71  Pulse: 85 85 75 82  Resp: 20 (!) 22 18   Temp: 98 F (36.7 C) 98.4 F (36.9 C) 98.5 F (36.9 C) 98.5 F (36.9 C)  TempSrc: Oral Oral  Oral  SpO2: 97% 96% 95% 97%  Weight:      Height:        Intake/Output Summary (Last 24 hours) at 05/10/2022 0926 Last data filed at 05/10/2022 0500 Gross per 24 hour  Intake 648.23 ml  Output --  Net 648.23 ml      05/09/2022   12:16 PM 05/08/2022    3:45 PM 05/08/2022   11:40 AM  Last 3 Weights  Weight (lbs) 231 lb 11.3 oz 232 lb 12.9 oz 236 lb  Weight (kg) 105.1 kg 105.6 kg 107.049 kg      Telemetry    SR  - Personally Reviewed  ECG    No new  - Personally Reviewed  Physical Exam   GEN: Obese 57 yo in no acute distress.   Neck: No JVD Cardiac: RRR, no murmurs,  Respiratory: Clear to auscultation bilaterally. GI: Soft, nontender, non-distended  MS:  No edema; No deformity. Neuro:  Nonfocal  Psych: Normal affect   Labs    High Sensitivity Troponin:   Recent Labs  Lab 05/08/22 1145 05/08/22 1429  TROPONINIHS 25* 26*     Chemistry Recent Labs  Lab 05/08/22 1145 05/09/22 0818 05/09/22 2040 05/10/22 0401 05/10/22 0829  NA 135 131*  --   --  134*  K 4.5 4.3  --   --  4.2  CL 98 101  --   --  104  CO2 24 23  --   --  21*  GLUCOSE 300* 284*  --   --  252*  BUN 22* 20  --   --  17  CREATININE 1.25* 1.12 1.36*  --  1.09  CALCIUM 9.2 8.6*  --   --  8.4*  MG  --   --   --  2.2  --   PROT  --  6.7  --   --  6.1*  ALBUMIN  --  3.8  --   --  3.3*  AST  --  26  --   --  21  ALT  --  45*  --   --  34  ALKPHOS  --  54  --   --  40  BILITOT  --  0.9  --   --  0.8  GFRNONAA >60 >60 >60  --  >60  ANIONGAP 13 7  --   --  9    Lipids  Recent Labs  Lab 05/09/22 0331  CHOL 263*  TRIG >400*  HDL 32*  LDLCALC UNABLE TO CALCULATE IF TRIGLYCERIDE OVER 400 mg/dL  CHOLHDL 8.2    Hematology Recent Labs  Lab 05/08/22 1145 05/09/22 2040 05/10/22 0829  WBC 5.5 3.6* 4.1  RBC 4.84 4.84 4.47  HGB 16.1 15.9 14.6  HCT 45.6 47.1 43.2  MCV 94.2 97.3 96.6  MCH 33.3 32.9 32.7  MCHC 35.3 33.8 33.8  RDW 12.6 12.7 12.4  PLT 176 104* 173   Thyroid  Recent Labs  Lab 05/08/22 1318  TSH 0.613    BNPNo results for input(s): "BNP", "PROBNP" in the last 168 hours.  DDimer No results for input(s): "DDIMER" in the last 168 hours.   Radiology    CARDIAC CATHETERIZATION  Result Date: 05/09/2022   Ost LAD to Prox LAD lesion is 50% stenosed.   Mid LAD lesion is 70% stenosed.   Mid Cx-1 lesion is 70% stenosed with 70% stenosed side branch in 1st Mrg.   Mid Cx-2 lesion is 70% stenosed.   Prox RCA lesion is 60% stenosed.   LV end diastolic pressure is normal. Moderate 3 vessel CAD. There is a 70% stenosis in the mid to distal LAD. 70 bifurcation stenosis in the LCx at OM1 and OM2. The proximal RCA lesion appears ulcerative but not obstructive.  Normal LVEDP Plan: recommend aggressive medical therapy and risk factor modification.   ECHOCARDIOGRAM COMPLETE  Result Date: 05/09/2022    ECHOCARDIOGRAM REPORT   Patient Name:   Tony Leon Date of Exam: 05/09/2022 Medical Rec #:  [id number]     Height:       69.0 in Accession #:    [id number]    Weight:       231.7 lb Date of Birth:  Dec 29, 1965     BSA:          2.199 m Patient Age:    89 years      BP:           114/77 mmHg Patient Gender: M             HR:           72 bpm. Exam Location:  Inpatient Procedure: 2D Echo, Color Doppler, Cardiac Doppler and Intracardiac            Opacification Agent Indications:    Chest Pain R07.9  History:        Patient has no prior history of Echocardiogram examinations.                 COPD; Risk Factors:Hypertension and Diabetes. GERD.  Sonographer:    Darlina Sicilian RDCS Referring Phys: [id number] [place]  1. Left ventricular ejection fraction, by estimation, is 60 to 65%. The left ventricle has normal function. The left ventricle has no regional wall motion abnormalities. There is mild concentric left ventricular hypertrophy. Left ventricular diastolic parameters are consistent with Grade I diastolic dysfunction (impaired relaxation).  2. Right ventricular systolic function is normal. The right ventricular size is normal.  3. The mitral valve is grossly normal. No evidence of mitral valve regurgitation. No evidence of mitral stenosis.  4. The aortic valve is tricuspid. Aortic valve regurgitation is not visualized. No aortic stenosis is present.  5. The inferior vena cava is normal in size with greater than 50% respiratory variability, suggesting right atrial pressure of 3 mmHg. FINDINGS  Left Ventricle: Left ventricular ejection fraction, by estimation, is 60 to 65%. The left ventricle has normal function. The left ventricle has no regional wall motion abnormalities. Definity contrast agent was given IV to delineate the left ventricular  endocardial  borders. The left ventricular internal cavity size was normal in size. There is mild concentric left ventricular hypertrophy. Left ventricular diastolic parameters are consistent with Grade I diastolic dysfunction (impaired relaxation). Right Ventricle: The right ventricular size is normal. No increase in right ventricular wall thickness. Right ventricular systolic function is normal. Left Atrium: Left atrial size was normal in size. Right Atrium: Right atrial size was normal in size. Pericardium: Trivial pericardial effusion is present. The pericardial effusion is circumferential. Mitral Valve: The mitral valve is grossly normal. No evidence of mitral valve regurgitation. No evidence of mitral valve stenosis. Tricuspid Valve: The tricuspid valve is grossly normal. Tricuspid valve regurgitation is not demonstrated. No evidence of tricuspid stenosis. Aortic Valve: The aortic valve is tricuspid. Aortic valve regurgitation is not visualized. No aortic stenosis is present. Pulmonic Valve: The pulmonic valve was grossly normal. Pulmonic valve regurgitation is not visualized. No evidence of pulmonic stenosis. Aorta: The aortic root and ascending aorta are structurally normal, with no evidence of dilitation. Venous: The inferior vena cava is normal in size with greater than 50% respiratory variability, suggesting right atrial pressure of 3 mmHg. IAS/Shunts: No atrial level shunt detected by color flow Doppler.  LEFT VENTRICLE PLAX 2D LVIDd:         4.90 cm   Diastology LVIDs:         3.50 cm   LV e' medial:    5.93 cm/s LV PW:         1.20 cm   LV E/e' medial:  9.5 LV IVS:        1.20 cm   LV e' lateral:   11.20 cm/s LVOT diam:     2.10 cm   LV E/e' lateral: 5.1 LV SV:         61 LV SV Index:   28 LVOT Area:     3.46 cm  RIGHT VENTRICLE RV S prime:     11.70 cm/s TAPSE (M-mode): 1.7 cm LEFT ATRIUM             Index        RIGHT ATRIUM          Index LA diam:        3.40 cm 1.55 cm/m   RA Area:     7.37 cm LA Vol (A2C):    39.4 ml 17.91 ml/m  RA Volume:   12.30 ml 5.59 ml/m LA Vol (A4C):   32.9 ml 14.96 ml/m LA Biplane Vol: 37.6 ml 17.10 ml/m  AORTIC VALVE LVOT Vmax:   96.60 cm/s LVOT Vmean:  70.000 cm/s LVOT VTI:    0.176 m  AORTA Ao Root diam: 3.50 cm Ao Asc diam:  3.10 cm MITRAL VALVE MV Area (PHT): 3.45 cm    SHUNTS MV Decel Time: 220 msec    Systemic VTI:  0.18 m MV E velocity: 56.60 cm/s  Systemic Diam: 2.10 cm  MV A velocity: 54.40 cm/s MV E/A ratio:  1.04 Jenkins Rouge MD Electronically signed by Jenkins Rouge MD Signature Date/Time: 05/09/2022/1:37:28 PM    Final    DG Chest 2 View  Result Date: 05/08/2022 CLINICAL DATA:  Chest EXAM: CHEST - 2 VIEW COMPARISON:  12/10/2012 FINDINGS: Cardiac and mediastinal contours are within normal limits. No focal pulmonary opacity. No pleural effusion or pneumothorax. No acute osseous abnormality. IMPRESSION: No acute cardiopulmonary process. Electronically Signed   By: Merilyn Baba M.D.   On: 05/08/2022 11:56    Cardiac Studies   L heart cath 05/09/22   Ost LAD to Prox LAD lesion is 50% stenosed.   Mid LAD lesion is 70% stenosed.   Mid Cx-1 lesion is 70% stenosed with 70% stenosed side branch in 1st Mrg.   Mid Cx-2 lesion is 70% stenosed.   Prox RCA lesion is 60% stenosed.   LV end diastolic pressure is normal.   Moderate 3 vessel CAD. There is a 70% stenosis in the mid to distal LAD. 70 bifurcation stenosis in the LCx at OM1 and OM2. The proximal RCA lesion appears ulcerative but not obstructive. Normal LVEDP  Echo 05/09/22    1. Left ventricular ejection fraction, by estimation, is 60 to 65%. The  left ventricle has normal function. The left ventricle has no regional  wall motion abnormalities. There is mild concentric left ventricular  hypertrophy. Left ventricular diastolic  parameters are consistent with Grade I diastolic dysfunction (impaired  relaxation).   2. Right ventricular systolic function is normal. The right ventricular  size is normal.   3. The  mitral valve is grossly normal. No evidence of mitral valve  regurgitation. No evidence of mitral stenosis.   4. The aortic valve is tricuspid. Aortic valve regurgitation is not  visualized. No aortic stenosis is present.   5. The inferior vena cava is normal in size with greater than 50%  respiratory variability, suggesting right atrial pressure of 3 mmHg.   Patient Profile     57 y.o. male with Hx of DM2, HTN, HL substance abuse preented with CP   Cath with CAD  Assessment & Plan    1  CAD  Cardiac cath noted above   Recomm aggressive risk modification  2  HTN  BP is controlled   3  HL LDL 120  Now on statin  4  DM   A1C 14.2     Patient admits to eating anything.   Utraprocessed foods       Recomm:   Recomm protein, veggies (lots)   Minimize processed food    No SSB Time restricted eating Goal to back down on meds over time as diet improves    Will follow up in clnic to continue to manage risk factors OK to d/c home   Will make sure he has follow up.  For questions or updates, please contact [place] Please consult [email or web address] for contact info under        Signed, Dorris Carnes, MD  05/10/2022, 9:26 AM

## 2022-05-10 NOTE — Progress Notes (Signed)
AVS given to patient and explained at the bedside. Medications and follow up appointments have been explained with pt verbalizing understanding. Insulin administration with insulin pen also taught at bedside before discharge.

## 2022-05-11 LAB — LIPOPROTEIN A (LPA): Lipoprotein (a): 53.2 nmol/L — ABNORMAL HIGH (ref <75.0)

## 2022-05-12 ENCOUNTER — Encounter (HOSPITAL_COMMUNITY): Payer: Self-pay | Admitting: Cardiology

## 2022-05-13 ENCOUNTER — Encounter: Payer: Self-pay | Admitting: Cardiology

## 2022-05-13 ENCOUNTER — Ambulatory Visit: Payer: BC Managed Care – PPO | Attending: Cardiology | Admitting: Cardiology

## 2022-05-13 ENCOUNTER — Encounter: Payer: Self-pay | Admitting: *Deleted

## 2022-05-13 VITALS — BP 108/70 | HR 90 | Ht 69.0 in | Wt 237.4 lb

## 2022-05-13 DIAGNOSIS — N289 Disorder of kidney and ureter, unspecified: Secondary | ICD-10-CM | POA: Insufficient documentation

## 2022-05-13 DIAGNOSIS — I10 Essential (primary) hypertension: Secondary | ICD-10-CM | POA: Diagnosis not present

## 2022-05-13 DIAGNOSIS — F17200 Nicotine dependence, unspecified, uncomplicated: Secondary | ICD-10-CM

## 2022-05-13 DIAGNOSIS — E1169 Type 2 diabetes mellitus with other specified complication: Secondary | ICD-10-CM | POA: Diagnosis not present

## 2022-05-13 DIAGNOSIS — I2510 Atherosclerotic heart disease of native coronary artery without angina pectoris: Secondary | ICD-10-CM

## 2022-05-13 DIAGNOSIS — J449 Chronic obstructive pulmonary disease, unspecified: Secondary | ICD-10-CM | POA: Insufficient documentation

## 2022-05-13 DIAGNOSIS — R32 Unspecified urinary incontinence: Secondary | ICD-10-CM | POA: Insufficient documentation

## 2022-05-13 DIAGNOSIS — E782 Mixed hyperlipidemia: Secondary | ICD-10-CM

## 2022-05-13 DIAGNOSIS — M1990 Unspecified osteoarthritis, unspecified site: Secondary | ICD-10-CM | POA: Insufficient documentation

## 2022-05-13 DIAGNOSIS — R0600 Dyspnea, unspecified: Secondary | ICD-10-CM | POA: Insufficient documentation

## 2022-05-13 DIAGNOSIS — Z923 Personal history of irradiation: Secondary | ICD-10-CM | POA: Insufficient documentation

## 2022-05-13 MED ORDER — ISOSORBIDE MONONITRATE ER 30 MG PO TB24: 30.0000 mg | ORAL_TABLET | Freq: Every day | ORAL | 3 refills | Status: AC

## 2022-05-13 MED ORDER — METOPROLOL SUCCINATE ER 25 MG PO TB24: 25.0000 mg | ORAL_TABLET | Freq: Every day | ORAL | 3 refills | Status: DC

## 2022-05-13 MED ORDER — LISINOPRIL 5 MG PO TABS: 10.0000 mg | ORAL_TABLET | Freq: Every day | ORAL | 3 refills | Status: AC

## 2022-05-13 MED ORDER — FENOFIBRATE 160 MG PO TABS: 160.0000 mg | ORAL_TABLET | Freq: Every day | ORAL | 3 refills | Status: DC

## 2022-05-13 MED ORDER — ATORVASTATIN CALCIUM 80 MG PO TABS: 80.0000 mg | ORAL_TABLET | Freq: Every day | ORAL | 3 refills | Status: DC

## 2022-05-13 MED ORDER — NITROGLYCERIN 0.4 MG SL SUBL: 0.4000 mg | SUBLINGUAL_TABLET | SUBLINGUAL | 1 refills | Status: AC | PRN

## 2022-05-13 NOTE — Patient Instructions (Signed)
Medication Instructions:  Your physician has recommended you make the following change in your medication:   START: Nitroglycerin 0.4 mg under the tongue every 5 minutes as needed for chest pain  *If you need a refill on your cardiac medications before your next appointment, please call your pharmacy*   Lab Work: Your physician recommends that you return for lab work in:   Labs in 1 month: Fasting lipids, CMP  If you have labs (blood work) drawn today and your tests are completely normal, you will receive your results only by: [place] (if you have MyChart) OR A paper copy in the mail If you have any lab test that is abnormal or we need to change your treatment, we will call you to review the results.   Testing/Procedures: None   Follow-Up: At [facility], you and your health needs are our priority.  As part of our continuing mission to provide you with exceptional heart care, we have created designated Provider Care Teams.  These Care Teams include your primary Cardiologist (physician) and Advanced Practice Providers (APPs -  Physician Assistants and Nurse Practitioners) who all work together to provide you with the care you need, when you need it.  We recommend signing up for the patient portal called "MyChart".  Sign up information is provided on this After Visit Summary.  MyChart is used to connect with patients for Virtual Visits (Telemedicine).  Patients are able to view lab/test results, encounter notes, upcoming appointments, etc.  Non-urgent messages can be sent to your provider as well.   To learn more about what you can do with MyChart, go to [email or web address].    Your next appointment:   2 month(s)  Provider:   Shirlee More, MD    Other Instructions This visit was accompanied by Leighton Ruff.

## 2022-05-13 NOTE — Progress Notes (Signed)
Cardiology Office Note:    Date:  05/13/2022   ID:  Tony Leon, DOB 1965/12/14, MRN [id number]  PCP:  Herbert Deaner, FNP  Cardiologist:  Shirlee More, MD   Referring MD: Herbert Deaner, FNP  ASSESSMENT:    1. Mixed hyperlipidemia   2. Coronary artery disease involving native coronary artery of native heart without angina pectoris   3. Primary hypertension   4. Type 2 diabetes mellitus with other specified complication, without long-term current use of insulin ([place])   5. Current every day smoker    PLAN:    In order of problems listed above:  Worked in a little prematurely today however he is tolerating and compliant with his lipid-lowering therapy in 1 month we will check a lipid profile fasting and triglycerides remain over 500 refer him to the lipid program. Stable CAD he is returned to work and is having no angina on his current medical therapy including aspirin oral nitrate beta-blocker and lisinopril for hypertension along with combined lipid-lowering treatment statin and fenofibric Diabetes sounds improved but there is concern of barriers to healthcare with cost managed by his PCP he has an appointment in the next week his A1c exceeded 14 at admission to the hospital To be given a prescription for nitroglycerin tablets  Next appointment 2 months   Medication Adjustments/Labs and Tests Ordered: Current medicines are reviewed at length with the patient today.  Concerns regarding medicines are outlined above.  No orders of the defined types were placed in this encounter.  No orders of the defined types were placed in this encounter.    Chief complaint I want to be seen today as if it my work schedule better  History of Present Illness:    Tony Leon is a 57 y.o. male who is being seen today for the evaluation of coronary artery disease type 2 diabetes hyperlipidemia and hypertension at the request of Herbert Deaner, FNP.  He also is a current smoker and  was counseled on cessation.  He was admitted to [facility] 05/10/2022 with chest pain.  He was last seen a few days ago by my partner Dr. Harrington Challenger 05/10/2022.  His high-sensitivity troponin was mildly elevated but there was no delta creatinine was 1.25 with a GFR greater than 60 cc.  His lipid profile showed severe dyslipidemia cholesterol 263 triglycerides greater than 400 HDL 32.  His LP(a) was mildly elevated at 53.  His initial triglycerides were 903. His lipid-lowering therapy was high intensity statin Lipitor 80 mg milligrams daily and fenofibrate.  There is a notation he is to take icosapent ethyl it was not prescribed  He underwent left heart catheterization 05/09/2022 showing moderate three-vessel coronary artery disease and advised medical treatment. Conclusion   Ost LAD to Prox LAD lesion is 50% stenosed.   Mid LAD lesion is 70% stenosed.   Mid Cx-1 lesion is 70% stenosed with 70% stenosed side branch in 1st Mrg.   Mid Cx-2 lesion is 70% stenosed.   Prox RCA lesion is 60% stenosed.   LV end diastolic pressure is normal.   Moderate 3 vessel CAD. There is a 70% stenosis in the mid to distal LAD. 70 bifurcation stenosis in the LCx at OM1 and OM2. The proximal RCA lesion appears ulcerative but not obstructive. Normal LVEDP   Plan: recommend aggressive medical therapy and risk factor modification.    Echocardiogram showed a normal ejection fraction with mild LVH there is no aortic valve calcification or  stenosis.  Since discharge she has returned to work and has had no anginal discomfort No chest pain edema shortness of breath palpitation or syncope Blood sugar midday today was in the mid 150s He is very worried about the cost of insulin He tolerates his lipid-lowering therapy without muscle pain or weakness I explained that it takes 4 to 6 weeks to see the full effect profile and lipid profile at that time and triglycerides remain severely elevated refer to the lipid clinic He  is not having itching or rash Past Medical History:  Diagnosis Date   Arthritis    Cancer ([place]) 12/10/2012   prostate cancer, bx. done 3 weeks ago   COPD (chronic obstructive pulmonary disease) ([place])    Coronary artery disease involving native coronary artery of native heart without angina pectoris 05/10/2022   Diabetes mellitus without complication ([place])    type 2   Diabetic neuropathy ([place]) 05/08/2022   DM2 (diabetes mellitus, type 2) ([place]) 05/08/2022   Dyspnea    with exertion   GERD (gastroesophageal reflux disease)    Headache(784.0)    stress headaches   History of kidney stones 12/10/2012   History of radiation therapy 11/08/15-01/03/16   prostate bed 68.4 Gy   HLD (hyperlipidemia) 05/08/2022   HTN (hypertension) 05/08/2022   Kidney disease    Marijuana abuse 05/08/2022   Prostate cancer ([place])    surgery 2013 and radiation 2017   Recurrent prostate carcinoma ([place]) 10/22/2015   Tobacco abuse 05/08/2022   Unstable angina ([place]) 05/09/2022   Urinary incontinence     Past Surgical History:  Procedure Laterality Date   CYSTOSCOPY N/A 10/16/2016   Procedure: CYSTOSCOPY WITH REMOVAL OF URETHRAL STONE;  Surgeon: Irine Seal, MD;  Location: [facility];  Service: Urology;  Laterality: N/A;   KIDNEY STONE SURGERY     x2   LEFT HEART CATH AND CORONARY ANGIOGRAPHY N/A 05/09/2022   Procedure: LEFT HEART CATH AND CORONARY ANGIOGRAPHY;  Surgeon: Martinique, Peter M, MD;  Location: [facility];  Service: Cardiovascular;  Laterality: N/A;   ROBOT ASSISTED LAPAROSCOPIC RADICAL PROSTATECTOMY N/A 12/15/2012   Procedure: ROBOTIC ASSISTED LAPAROSCOPIC RADICAL PROSTATECTOMY WITH LYMPH NODE DISSECTION ;  Surgeon: Malka So, MD;  Location: [facility];  Service: Urology;  Laterality: N/A;   WISDOM TOOTH EXTRACTION      Current Medications: Current Meds  Medication Sig   aspirin EC 81 MG tablet Take 1 tablet (81 mg total) by mouth daily. Swallow whole.   atorvastatin (LIPITOR) 80 MG tablet Take 1  tablet (80 mg total) by mouth daily.   cetirizine (ZYRTEC) 10 MG tablet Take 10 mg by mouth daily.   Empagliflozin-metFORMIN HCl ER 12.08-998 MG TB24 Take 1 tablet by mouth 2 (two) times daily.   ePHEDrine Sulfate 25 MG TABS Take 1 tablet by mouth daily.   fenofibrate 160 MG tablet Take 1 tablet (160 mg total) by mouth daily.   gabapentin (NEURONTIN) 300 MG capsule Take 300 mg by mouth daily.   insulin aspart (NOVOLOG) 100 UNIT/ML FlexPen Before each meal 3 times a day, 140-199 - 2 units, 200-250 - 4 units, 251-299 - 6 units,  300-349 - 8 units,  350 or above 10 units. Insulin PEN if approved, provide syringes and needles if needed.   insulin detemir (LEVEMIR) 100 UNIT/ML FlexPen Inject 8 Units into the skin at bedtime.   isosorbide mononitrate (IMDUR) 30 MG 24 hr tablet Take 1 tablet (30 mg total) by mouth at bedtime.  lisinopril (ZESTRIL) 5 MG tablet Take 10 mg by mouth daily.   metoprolol succinate (TOPROL-XL) 25 MG 24 hr tablet Take 1 tablet (25 mg total) by mouth daily.   pantoprazole (PROTONIX) 20 MG tablet Take 20 mg by mouth daily.     Allergies:   Patient has no known allergies.   Social History   Socioeconomic History   Marital status: Single    Spouse name: Not on file   Number of children: 0   Years of education: Not on file   Highest education level: Not on file  Occupational History   Occupation: Oceanographer  Tobacco Use   Smoking status: Every Day    Packs/day: 1.50    Years: 30.00    Total pack years: 45.00    Types: Cigarettes   Smokeless tobacco: Never  Vaping Use   Vaping Use: Never used  Substance and Sexual Activity   Alcohol use: Yes    Alcohol/week: 12.0 standard drinks of alcohol    Types: 12 Standard drinks or equivalent per week    Comment: liquor 3 x week   Drug use: Yes    Types: Marijuana    Comment: occ   Sexual activity: Yes  Other Topics Concern   Not on file  Social History Narrative   Not on file   Social Determinants of Health    Financial Resource Strain: Not on file  Food Insecurity: No Food Insecurity (05/08/2022)   Hunger Vital Sign    Worried About Running Out of Food in the Last Year: Never true    Ran Out of Food in the Last Year: Never true  Transportation Needs: No Transportation Needs (05/08/2022)   PRAPARE - Hydrologist (Medical): No    Lack of Transportation (Non-Medical): No  Physical Activity: Not on file  Stress: Not on file  Social Connections: Not on file     Family History: The patient's family history includes Arthritis in his mother; Heart attack in his sister; Prostate cancer in his father.  ROS:   ROS Please see the history of present illness.     All other systems reviewed and are negative.  EKGs/Labs/Other Studies Reviewed:    The following studies were reviewed today:  Recent Labs: 05/08/2022: TSH 0.613 05/10/2022: ALT 34; BUN 17; Creatinine, Ser 1.09; Hemoglobin 14.6; Magnesium 2.2; Platelets 173; Potassium 4.2; Sodium 134  Recent Lipid Panel    Component Value Date/Time   CHOL 263 (H) 05/09/2022 0331   TRIG >400 (H) 05/09/2022 0331   HDL 32 (L) 05/09/2022 0331   CHOLHDL 8.2 05/09/2022 0331   VLDL UNABLE TO CALCULATE IF TRIGLYCERIDE OVER 400 mg/dL 05/09/2022 0331   LDLCALC UNABLE TO CALCULATE IF TRIGLYCERIDE OVER 400 mg/dL 05/09/2022 0331   LDLDIRECT 120 (H) 05/09/2022 0331    Physical Exam:    VS:  BP 108/70 (BP Location: Right Arm, Patient Position: Sitting)   Pulse 90   Ht '5\' 9"'$  (1.753 m)   Wt 237 lb 6.4 oz (107.7 kg)   SpO2 96%   BMI 35.06 kg/m     Wt Readings from Last 3 Encounters:  05/13/22 237 lb 6.4 oz (107.7 kg)  05/09/22 231 lb 11.3 oz (105.1 kg)  10/16/16 265 lb (120.2 kg)     GEN:  Well nourished, well developed in no acute distress HEENT: Normal NECK: No JVD; No carotid bruits LYMPHATICS: No lymphadenopathy CARDIAC: RRR, no murmurs, rubs, gallops RESPIRATORY:  Clear to auscultation without rales, wheezing  or rhonchi   ABDOMEN: Soft, non-tender, non-distended MUSCULOSKELETAL:  No edema; No deformity  SKIN: Warm and dry NEUROLOGIC:  Alert and oriented x 3 PSYCHIATRIC:  Normal affect   He was seen with Leighton Ruff, CMA chaperone  Signed, Shirlee More, MD  05/13/2022 1:34 PM    [facility]

## 2022-05-13 NOTE — Addendum Note (Signed)
Addended by: Edwyna Shell I on: 05/13/2022 03:20 PM   Modules accepted: Orders

## 2022-05-20 ENCOUNTER — Ambulatory Visit: Payer: BLUE CROSS/BLUE SHIELD | Admitting: General Practice

## 2022-05-26 DIAGNOSIS — E781 Pure hyperglyceridemia: Secondary | ICD-10-CM | POA: Diagnosis not present

## 2022-05-26 DIAGNOSIS — E119 Type 2 diabetes mellitus without complications: Secondary | ICD-10-CM | POA: Diagnosis not present

## 2022-05-26 DIAGNOSIS — I2510 Atherosclerotic heart disease of native coronary artery without angina pectoris: Secondary | ICD-10-CM | POA: Diagnosis not present

## 2022-05-26 DIAGNOSIS — E785 Hyperlipidemia, unspecified: Secondary | ICD-10-CM | POA: Diagnosis not present

## 2022-05-27 DIAGNOSIS — I2510 Atherosclerotic heart disease of native coronary artery without angina pectoris: Secondary | ICD-10-CM | POA: Diagnosis not present

## 2022-05-27 DIAGNOSIS — N289 Disorder of kidney and ureter, unspecified: Secondary | ICD-10-CM | POA: Diagnosis not present

## 2022-07-14 NOTE — Progress Notes (Unsigned)
Cardiology Office Note:    Date:  07/14/2022   ID:  Tony SiasMorris E Vandervoort, DOB March 18, 1966, MRN [id number]  PCP:  Ellis ParentsPhillips, Tracey B, FNP  Cardiologist:  Norman HerrlichBrian Kenzlei Runions, MD    Referring MD: Ellis ParentsPhillips, Tracey B, FNP    ASSESSMENT:    No diagnosis found. PLAN:    In order of problems listed above:  ***   Next appointment: ***   Medication Adjustments/Labs and Tests Ordered: Current medicines are reviewed at length with the patient today.  Concerns regarding medicines are outlined above.  No orders of the defined types were placed in this encounter.  No orders of the defined types were placed in this encounter.   No chief complaint on file.   History of Present Illness:    Tony Leon is a 57 y.o. male with a hx of CAD type 2 diabetes hyperlipidemia hypertension and tobacco abuse last seen 06/02/2022.  [facility] with chest pain.  He was last seen a few days ago by my partner Dr. Tenny Crawoss 05/10/2022.  His high-sensitivity troponin was mildly elevated but there was no delta creatinine was 1.25 with a GFR greater than 60 cc.  His lipid profile showed severe dyslipidemia cholesterol 263 triglycerides greater than 400 HDL 32.  His LP(a) was mildly elevated at 53.  His initial triglycerides were 903. His lipid-lowering therapy was high intensity statin Lipitor 80 mg milligrams daily and fenofibrate.  There is a notation he is to take icosapent ethyl it was not prescribed   He underwent left heart catheterization 05/09/2022 showing moderate three-vessel coronary artery disease and advised medical treatment. Conclusion   Ost LAD to Prox LAD lesion is 50% stenosed.   Mid LAD lesion is 70% stenosed.   Mid Cx-1 lesion is 70% stenosed with 70% stenosed side branch in 1st Mrg.   Mid Cx-2 lesion is 70% stenosed.   Prox RCA lesion is 60% stenosed.   LV end diastolic pressure is normal.   Moderate 3 vessel CAD. There is a 70% stenosis in the mid to distal LAD.  70 bifurcation stenosis in the LCx at OM1 and OM2. The proximal RCA lesion appears ulcerative but not obstructive. Normal LVEDP   Plan: recommend aggressive medical therapy and risk factor modification.    Echocardiogram showed a normal ejection fraction with mild LVH there is no aortic valve calcification or stenosis.   Compliance with diet, lifestyle and medications: *** Past Medical History:  Diagnosis Date   Arthritis    Cancer (HCC) 12/10/2012   prostate cancer, bx. done 3 weeks ago   COPD (chronic obstructive pulmonary disease) (HCC)    Coronary artery disease involving native coronary artery of native heart without angina pectoris 05/10/2022   Diabetes mellitus without complication (HCC)    type 2   Diabetic neuropathy (HCC) 05/08/2022   DM2 (diabetes mellitus, type 2) (HCC) 05/08/2022   Dyspnea    with exertion   GERD (gastroesophageal reflux disease)    Headache(784.0)    stress headaches   History of kidney stones 12/10/2012   History of radiation therapy 11/08/15-01/03/16   prostate bed 68.4 Gy   HLD (hyperlipidemia) 05/08/2022   HTN (hypertension) 05/08/2022   Kidney disease    Marijuana abuse 05/08/2022   Prostate cancer (HCC)    surgery 2013 and radiation 2017   Recurrent prostate carcinoma (HCC) 10/22/2015   Tobacco abuse 05/08/2022   Unstable angina (HCC) 05/09/2022   Urinary incontinence     Past  Surgical History:  Procedure Laterality Date   CYSTOSCOPY N/A 10/16/2016   Procedure: CYSTOSCOPY WITH REMOVAL OF URETHRAL STONE;  Surgeon: Bjorn Pippin, MD;  Location: [facility];  Service: Urology;  Laterality: N/A;   KIDNEY STONE SURGERY     x2   LEFT HEART CATH AND CORONARY ANGIOGRAPHY N/A 05/09/2022   Procedure: LEFT HEART CATH AND CORONARY ANGIOGRAPHY;  Surgeon: [place], Peter M, MD;  Location: [facility] INVASIVE CV LAB;  Service: Cardiovascular;  Laterality: N/A;   ROBOT ASSISTED LAPAROSCOPIC RADICAL PROSTATECTOMY N/A 12/15/2012   Procedure: ROBOTIC ASSISTED LAPAROSCOPIC  RADICAL PROSTATECTOMY WITH LYMPH NODE DISSECTION ;  Surgeon: Anner Crete, MD;  Location: [facility];  Service: Urology;  Laterality: N/A;   WISDOM TOOTH EXTRACTION      Current Medications: No outpatient medications have been marked as taking for the 07/16/22 encounter (Appointment) with Baldo Daub, MD.     Allergies:   Patient has no known allergies.   Social History   Socioeconomic History   Marital status: Single    Spouse name: Not on file   Number of children: 0   Years of education: Not on file   Highest education level: Not on file  Occupational History   Occupation: Printmaker  Tobacco Use   Smoking status: Every Day    Packs/day: 1.50    Years: 30.00    Additional pack years: 0.00    Total pack years: 45.00    Types: Cigarettes   Smokeless tobacco: Never  Vaping Use   Vaping Use: Never used  Substance and Sexual Activity   Alcohol use: Yes    Alcohol/week: 12.0 standard drinks of alcohol    Types: 12 Standard drinks or equivalent per week    Comment: liquor 3 x week   Drug use: Yes    Types: Marijuana    Comment: occ   Sexual activity: Yes  Other Topics Concern   Not on file  Social History Narrative   Not on file   Social Determinants of Health   Financial Resource Strain: Not on file  Food Insecurity: No Food Insecurity (05/08/2022)   Hunger Vital Sign    Worried About Running Out of Food in the Last Year: Never true    Ran Out of Food in the Last Year: Never true  Transportation Needs: No Transportation Needs (05/08/2022)   PRAPARE - Administrator, Civil Service (Medical): No    Lack of Transportation (Non-Medical): No  Physical Activity: Not on file  Stress: Not on file  Social Connections: Not on file     Family History: The patient's ***family history includes Arthritis in his mother; Heart attack in his sister; Prostate cancer in his father. ROS:   Please see the history of present illness.    All other systems reviewed and are  negative.  EKGs/Labs/Other Studies Reviewed:    The following studies were reviewed today:  Cardiac Studies & Procedures   CARDIAC CATHETERIZATION  CARDIAC CATHETERIZATION 05/09/2022  Narrative   Ost LAD to Prox LAD lesion is 50% stenosed.   Mid LAD lesion is 70% stenosed.   Mid Cx-1 lesion is 70% stenosed with 70% stenosed side branch in 1st Mrg.   Mid Cx-2 lesion is 70% stenosed.   Prox RCA lesion is 60% stenosed.   LV end diastolic pressure is normal.  Moderate 3 vessel CAD. There is a 70% stenosis in the mid to distal LAD. 70 bifurcation stenosis in the LCx at OM1 and OM2. The  proximal RCA lesion appears ulcerative but not obstructive. Normal LVEDP  Plan: recommend aggressive medical therapy and risk factor modification.  Findings Coronary Findings Diagnostic  Dominance: Right  Left Main Vessel was injected. Vessel is normal in caliber. Vessel is angiographically normal.  Left Anterior Descending Ost LAD to Prox LAD lesion is 50% stenosed. Mid LAD lesion is 70% stenosed.  Left Circumflex Mid Cx-1 lesion is 70% stenosed with 70% stenosed side branch in 1st Mrg. Mid Cx-2 lesion is 70% stenosed.  Right Coronary Artery Prox RCA lesion is 60% stenosed. The lesion is ulcerative.  Intervention  No interventions have been documented.     ECHOCARDIOGRAM  ECHOCARDIOGRAM COMPLETE 05/09/2022  Narrative ECHOCARDIOGRAM REPORT    Patient Name:   PASTOR YELL Date of Exam: 05/09/2022 Medical Rec #:  [id number]     Height:       69.0 in Accession #:    [id number]    Weight:       231.7 lb Date of Birth:  11-09-1965     BSA:          2.199 m Patient Age:    56 years      BP:           114/77 mmHg Patient Gender: M             HR:           72 bpm. Exam Location:  Inpatient  Procedure: 2D Echo, Color Doppler, Cardiac Doppler and Intracardiac Opacification Agent  Indications:    Chest Pain R07.9  History:        Patient has no prior history of Echocardiogram  examinations. COPD; Risk Factors:Hypertension and Diabetes. GERD.  Sonographer:    Leta Jungling RDCS Referring Phys: [id number] Teddy Spike  IMPRESSIONS   1. Left ventricular ejection fraction, by estimation, is 60 to 65%. The left ventricle has normal function. The left ventricle has no regional wall motion abnormalities. There is mild concentric left ventricular hypertrophy. Left ventricular diastolic parameters are consistent with Grade I diastolic dysfunction (impaired relaxation). 2. Right ventricular systolic function is normal. The right ventricular size is normal. 3. The mitral valve is grossly normal. No evidence of mitral valve regurgitation. No evidence of mitral stenosis. 4. The aortic valve is tricuspid. Aortic valve regurgitation is not visualized. No aortic stenosis is present. 5. The inferior vena cava is normal in size with greater than 50% respiratory variability, suggesting right atrial pressure of 3 mmHg.  FINDINGS Left Ventricle: Left ventricular ejection fraction, by estimation, is 60 to 65%. The left ventricle has normal function. The left ventricle has no regional wall motion abnormalities. Definity contrast agent was given IV to delineate the left ventricular endocardial borders. The left ventricular internal cavity size was normal in size. There is mild concentric left ventricular hypertrophy. Left ventricular diastolic parameters are consistent with Grade I diastolic dysfunction (impaired relaxation).  Right Ventricle: The right ventricular size is normal. No increase in right ventricular wall thickness. Right ventricular systolic function is normal.  Left Atrium: Left atrial size was normal in size.  Right Atrium: Right atrial size was normal in size.  Pericardium: Trivial pericardial effusion is present. The pericardial effusion is circumferential.  Mitral Valve: The mitral valve is grossly normal. No evidence of mitral valve regurgitation. No evidence of  mitral valve stenosis.  Tricuspid Valve: The tricuspid valve is grossly normal. Tricuspid valve regurgitation is not demonstrated. No evidence of tricuspid stenosis.  Aortic Valve: The aortic valve  is tricuspid. Aortic valve regurgitation is not visualized. No aortic stenosis is present.  Pulmonic Valve: The pulmonic valve was grossly normal. Pulmonic valve regurgitation is not visualized. No evidence of pulmonic stenosis.  Aorta: The aortic root and ascending aorta are structurally normal, with no evidence of dilitation.  Venous: The inferior vena cava is normal in size with greater than 50% respiratory variability, suggesting right atrial pressure of 3 mmHg.  IAS/Shunts: No atrial level shunt detected by color flow Doppler.   LEFT VENTRICLE PLAX 2D LVIDd:         4.90 cm   Diastology LVIDs:         3.50 cm   LV e' medial:    5.93 cm/s LV PW:         1.20 cm   LV E/e' medial:  9.5 LV IVS:        1.20 cm   LV e' lateral:   11.20 cm/s LVOT diam:     2.10 cm   LV E/e' lateral: 5.1 LV SV:         61 LV SV Index:   28 LVOT Area:     3.46 cm   RIGHT VENTRICLE RV S prime:     11.70 cm/s TAPSE (M-mode): 1.7 cm  LEFT ATRIUM             Index        RIGHT ATRIUM          Index LA diam:        3.40 cm 1.55 cm/m   RA Area:     7.37 cm LA Vol (A2C):   39.4 ml 17.91 ml/m  RA Volume:   12.30 ml 5.59 ml/m LA Vol (A4C):   32.9 ml 14.96 ml/m LA Biplane Vol: 37.6 ml 17.10 ml/m AORTIC VALVE LVOT Vmax:   96.60 cm/s LVOT Vmean:  70.000 cm/s LVOT VTI:    0.176 m  AORTA Ao Root diam: 3.50 cm Ao Asc diam:  3.10 cm  MITRAL VALVE MV Area (PHT): 3.45 cm    SHUNTS MV Decel Time: 220 msec    Systemic VTI:  0.18 m MV E velocity: 56.60 cm/s  Systemic Diam: 2.10 cm MV A velocity: 54.40 cm/s MV E/A ratio:  1.04  Charlton Haws MD Electronically signed by Charlton Haws MD Signature Date/Time: 05/09/2022/1:37:28 PM    Final             EKG:  EKG ordered today and personally reviewed.   The ekg ordered today demonstrates ***  Recent Labs: 05/08/2022: TSH 0.613 05/10/2022: ALT 34; BUN 17; Creatinine, Ser 1.09; Hemoglobin 14.6; Magnesium 2.2; Platelets 173; Potassium 4.2; Sodium 134  Recent Lipid Panel    Component Value Date/Time   CHOL 263 (H) 05/09/2022 0331   TRIG >400 (H) 05/09/2022 0331   HDL 32 (L) 05/09/2022 0331   CHOLHDL 8.2 05/09/2022 0331   VLDL UNABLE TO CALCULATE IF TRIGLYCERIDE OVER 400 mg/dL 59/97/7414 2395   LDLCALC UNABLE TO CALCULATE IF TRIGLYCERIDE OVER 400 mg/dL 32/05/3341 5686   LDLDIRECT 120 (H) 05/09/2022 0331    Physical Exam:    VS:  There were no vitals taken for this visit.    Wt Readings from Last 3 Encounters:  05/13/22 237 lb 6.4 oz (107.7 kg)  05/09/22 231 lb 11.3 oz (105.1 kg)  10/16/16 265 lb (120.2 kg)     GEN: *** Well nourished, well developed in no acute distress HEENT: Normal NECK: No JVD; No carotid bruits  LYMPHATICS: No lymphadenopathy CARDIAC: ***RRR, no murmurs, rubs, gallops RESPIRATORY:  Clear to auscultation without rales, wheezing or rhonchi  ABDOMEN: Soft, non-tender, non-distended MUSCULOSKELETAL:  No edema; No deformity  SKIN: Warm and dry NEUROLOGIC:  Alert and oriented x 3 PSYCHIATRIC:  Normal affect    Signed, Norman Herrlich, MD  07/14/2022 3:08 PM    [facility]

## 2022-07-16 ENCOUNTER — Ambulatory Visit: Payer: BC Managed Care – PPO | Attending: Cardiology | Admitting: Cardiology

## 2022-07-16 ENCOUNTER — Encounter: Payer: Self-pay | Admitting: Cardiology

## 2022-07-16 VITALS — BP 112/66 | HR 86 | Ht 70.0 in | Wt 234.0 lb

## 2022-07-16 DIAGNOSIS — E782 Mixed hyperlipidemia: Secondary | ICD-10-CM

## 2022-07-16 DIAGNOSIS — E1169 Type 2 diabetes mellitus with other specified complication: Secondary | ICD-10-CM | POA: Diagnosis not present

## 2022-07-16 DIAGNOSIS — I2510 Atherosclerotic heart disease of native coronary artery without angina pectoris: Secondary | ICD-10-CM | POA: Diagnosis not present

## 2022-07-16 DIAGNOSIS — I10 Essential (primary) hypertension: Secondary | ICD-10-CM | POA: Diagnosis not present

## 2022-07-16 NOTE — Patient Instructions (Signed)
Medication Instructions:  Your physician recommends that you continue on your current medications as directed. Please refer to the Current Medication list given to you today.  *If you need a refill on your cardiac medications before your next appointment, please call your pharmacy*   Lab Work: Your physician recommends that you return for lab work in:   Labs today: CMP, Lipids, Direct LDL, Hbg A1c  If you have labs (blood work) drawn today and your tests are completely normal, you will receive your results only by: MyChart Message (if you have MyChart) OR A paper copy in the mail If you have any lab test that is abnormal or we need to change your treatment, we will call you to review the results.   Testing/Procedures: None   Follow-Up: At [facility], you and your health needs are our priority.  As part of our continuing mission to provide you with exceptional heart care, we have created designated Provider Care Teams.  These Care Teams include your primary Cardiologist (physician) and Advanced Practice Providers (APPs -  Physician Assistants and Nurse Practitioners) who all work together to provide you with the care you need, when you need it.  We recommend signing up for the patient portal called "MyChart".  Sign up information is provided on this After Visit Summary.  MyChart is used to connect with patients for Virtual Visits (Telemedicine).  Patients are able to view lab/test results, encounter notes, upcoming appointments, etc.  Non-urgent messages can be sent to your provider as well.   To learn more about what you can do with MyChart, go to [email or web address].    Your next appointment:   6 month(s)  Provider:   Norman Herrlich, MD    Other Instructions None

## 2022-07-16 NOTE — Addendum Note (Signed)
Addended by: Roxanne Mins I on: 07/16/2022 04:28 PM   Modules accepted: Orders

## 2022-07-17 LAB — LIPID PANEL
Chol/HDL Ratio: 2.7 ratio (ref 0.0–5.0)
Cholesterol, Total: 128 mg/dL (ref 100–199)
HDL: 47 mg/dL (ref >39)
LDL Chol Calc (NIH): 53 mg/dL (ref 0–99)
Triglycerides: 164 mg/dL — ABNORMAL HIGH (ref 0–149)
VLDL Cholesterol Cal: 28 mg/dL (ref 5–40)

## 2022-07-17 LAB — COMPREHENSIVE METABOLIC PANEL
ALT: 22 IU/L (ref 0–44)
AST: 19 IU/L (ref 0–40)
Albumin/Globulin Ratio: 1.9 (ref 1.2–2.2)
Albumin: 4.5 g/dL (ref 3.8–4.9)
Alkaline Phosphatase: 61 IU/L (ref 44–121)
BUN/Creatinine Ratio: 24 — ABNORMAL HIGH (ref 9–20)
BUN: 29 mg/dL — ABNORMAL HIGH (ref 6–24)
Bilirubin Total: 0.3 mg/dL (ref 0.0–1.2)
CO2: 22 mmol/L (ref 20–29)
Calcium: 10.3 mg/dL — ABNORMAL HIGH (ref 8.7–10.2)
Chloride: 102 mmol/L (ref 96–106)
Creatinine, Ser: 1.19 mg/dL (ref 0.76–1.27)
Globulin, Total: 2.4 g/dL (ref 1.5–4.5)
Glucose: 131 mg/dL — ABNORMAL HIGH (ref 70–99)
Potassium: 4.3 mmol/L (ref 3.5–5.2)
Sodium: 140 mmol/L (ref 134–144)
Total Protein: 6.9 g/dL (ref 6.0–8.5)
eGFR: 72 mL/min/{1.73_m2} (ref >59)

## 2022-07-17 LAB — LDL CHOLESTEROL, DIRECT: LDL Direct: 64 mg/dL (ref 0–99)

## 2022-07-17 LAB — HEMOGLOBIN A1C
Est. average glucose Bld gHb Est-mCnc: 194 mg/dL
Hgb A1c MFr Bld: 8.4 % — ABNORMAL HIGH (ref 4.8–5.6)

## 2022-08-18 ENCOUNTER — Encounter: Payer: Self-pay | Admitting: *Deleted

## 2022-08-29 DIAGNOSIS — I10 Essential (primary) hypertension: Secondary | ICD-10-CM | POA: Diagnosis not present

## 2022-08-29 DIAGNOSIS — E781 Pure hyperglyceridemia: Secondary | ICD-10-CM | POA: Diagnosis not present

## 2022-08-29 DIAGNOSIS — E119 Type 2 diabetes mellitus without complications: Secondary | ICD-10-CM | POA: Diagnosis not present

## 2022-08-29 DIAGNOSIS — F1010 Alcohol abuse, uncomplicated: Secondary | ICD-10-CM | POA: Diagnosis not present

## 2022-12-03 DIAGNOSIS — E119 Type 2 diabetes mellitus without complications: Secondary | ICD-10-CM | POA: Diagnosis not present

## 2022-12-03 DIAGNOSIS — E785 Hyperlipidemia, unspecified: Secondary | ICD-10-CM | POA: Diagnosis not present

## 2022-12-03 DIAGNOSIS — N289 Disorder of kidney and ureter, unspecified: Secondary | ICD-10-CM | POA: Diagnosis not present

## 2022-12-03 DIAGNOSIS — E781 Pure hyperglyceridemia: Secondary | ICD-10-CM | POA: Diagnosis not present

## 2022-12-09 DIAGNOSIS — C61 Malignant neoplasm of prostate: Secondary | ICD-10-CM | POA: Diagnosis not present

## 2023-03-25 DIAGNOSIS — Z79899 Other long term (current) drug therapy: Secondary | ICD-10-CM | POA: Diagnosis not present

## 2023-03-25 DIAGNOSIS — I10 Essential (primary) hypertension: Secondary | ICD-10-CM | POA: Diagnosis not present

## 2023-03-25 DIAGNOSIS — Z7982 Long term (current) use of aspirin: Secondary | ICD-10-CM | POA: Diagnosis not present

## 2023-03-25 DIAGNOSIS — K219 Gastro-esophageal reflux disease without esophagitis: Secondary | ICD-10-CM | POA: Diagnosis not present

## 2023-03-25 DIAGNOSIS — E119 Type 2 diabetes mellitus without complications: Secondary | ICD-10-CM | POA: Diagnosis not present

## 2023-03-25 DIAGNOSIS — N529 Male erectile dysfunction, unspecified: Secondary | ICD-10-CM | POA: Diagnosis not present

## 2023-03-26 DIAGNOSIS — Z79899 Other long term (current) drug therapy: Secondary | ICD-10-CM | POA: Diagnosis not present

## 2023-03-26 DIAGNOSIS — E119 Type 2 diabetes mellitus without complications: Secondary | ICD-10-CM | POA: Diagnosis not present

## 2023-03-26 DIAGNOSIS — I10 Essential (primary) hypertension: Secondary | ICD-10-CM | POA: Diagnosis not present

## 2023-03-26 DIAGNOSIS — N529 Male erectile dysfunction, unspecified: Secondary | ICD-10-CM | POA: Diagnosis not present

## 2023-03-26 DIAGNOSIS — K219 Gastro-esophageal reflux disease without esophagitis: Secondary | ICD-10-CM | POA: Diagnosis not present

## 2023-03-26 DIAGNOSIS — Z7982 Long term (current) use of aspirin: Secondary | ICD-10-CM | POA: Diagnosis not present

## 2023-04-02 DIAGNOSIS — N529 Male erectile dysfunction, unspecified: Secondary | ICD-10-CM | POA: Diagnosis not present

## 2023-04-09 DIAGNOSIS — N529 Male erectile dysfunction, unspecified: Secondary | ICD-10-CM | POA: Diagnosis not present

## 2023-05-21 DIAGNOSIS — N529 Male erectile dysfunction, unspecified: Secondary | ICD-10-CM | POA: Diagnosis not present

## 2023-06-08 DIAGNOSIS — Z1331 Encounter for screening for depression: Secondary | ICD-10-CM | POA: Diagnosis not present

## 2023-06-08 DIAGNOSIS — E119 Type 2 diabetes mellitus without complications: Secondary | ICD-10-CM | POA: Diagnosis not present

## 2023-06-08 DIAGNOSIS — E785 Hyperlipidemia, unspecified: Secondary | ICD-10-CM | POA: Diagnosis not present

## 2023-06-08 DIAGNOSIS — I2510 Atherosclerotic heart disease of native coronary artery without angina pectoris: Secondary | ICD-10-CM | POA: Diagnosis not present

## 2023-06-08 DIAGNOSIS — I10 Essential (primary) hypertension: Secondary | ICD-10-CM | POA: Diagnosis not present

## 2023-06-14 ENCOUNTER — Other Ambulatory Visit: Payer: Self-pay | Admitting: Cardiology

## 2023-06-15 ENCOUNTER — Other Ambulatory Visit: Payer: Self-pay | Admitting: Cardiology

## 2023-08-20 DIAGNOSIS — C61 Malignant neoplasm of prostate: Secondary | ICD-10-CM | POA: Diagnosis not present

## 2023-09-15 ENCOUNTER — Other Ambulatory Visit: Payer: Self-pay | Admitting: Cardiology

## 2023-10-15 DIAGNOSIS — E785 Hyperlipidemia, unspecified: Secondary | ICD-10-CM | POA: Diagnosis not present

## 2023-10-15 DIAGNOSIS — I10 Essential (primary) hypertension: Secondary | ICD-10-CM | POA: Diagnosis not present

## 2023-10-15 DIAGNOSIS — R21 Rash and other nonspecific skin eruption: Secondary | ICD-10-CM | POA: Diagnosis not present

## 2023-10-15 DIAGNOSIS — C61 Malignant neoplasm of prostate: Secondary | ICD-10-CM | POA: Diagnosis not present

## 2023-10-15 DIAGNOSIS — E119 Type 2 diabetes mellitus without complications: Secondary | ICD-10-CM | POA: Diagnosis not present

## 2023-12-02 DIAGNOSIS — M273 Alveolitis of jaws: Secondary | ICD-10-CM | POA: Diagnosis not present

## 2023-12-02 DIAGNOSIS — T8149XA Infection following a procedure, other surgical site, initial encounter: Secondary | ICD-10-CM | POA: Diagnosis not present

## 2023-12-02 DIAGNOSIS — J3489 Other specified disorders of nose and nasal sinuses: Secondary | ICD-10-CM | POA: Diagnosis not present

## 2023-12-17 DIAGNOSIS — B880 Other acariasis: Secondary | ICD-10-CM | POA: Diagnosis not present

## 2024-01-27 DIAGNOSIS — M542 Cervicalgia: Secondary | ICD-10-CM | POA: Diagnosis not present

## 2024-01-28 DIAGNOSIS — M7531 Calcific tendinitis of right shoulder: Secondary | ICD-10-CM | POA: Diagnosis not present

## 2024-01-28 DIAGNOSIS — M5412 Radiculopathy, cervical region: Secondary | ICD-10-CM | POA: Diagnosis not present

## 2024-02-01 DIAGNOSIS — M542 Cervicalgia: Secondary | ICD-10-CM | POA: Diagnosis not present

## 2024-02-01 DIAGNOSIS — M5412 Radiculopathy, cervical region: Secondary | ICD-10-CM | POA: Diagnosis not present

## 2024-02-08 DIAGNOSIS — G5691 Unspecified mononeuropathy of right upper limb: Secondary | ICD-10-CM | POA: Diagnosis not present

## 2024-02-08 DIAGNOSIS — Z23 Encounter for immunization: Secondary | ICD-10-CM | POA: Diagnosis not present

## 2024-02-08 DIAGNOSIS — M542 Cervicalgia: Secondary | ICD-10-CM | POA: Diagnosis not present
